# Patient Record
Sex: Male | Born: 1945
Health system: Southern US, Community
[De-identification: ages and names within clinical notes are randomized; demographics above are authoritative.]

## PROBLEM LIST (undated history)

## (undated) DIAGNOSIS — I471 Supraventricular tachycardia, unspecified: Secondary | ICD-10-CM

## (undated) DIAGNOSIS — H905 Unspecified sensorineural hearing loss: Secondary | ICD-10-CM

## (undated) DIAGNOSIS — H6690 Otitis media, unspecified, unspecified ear: Secondary | ICD-10-CM

## (undated) DIAGNOSIS — H902 Conductive hearing loss, unspecified: Secondary | ICD-10-CM

## (undated) DIAGNOSIS — G4730 Sleep apnea, unspecified: Secondary | ICD-10-CM

## (undated) DIAGNOSIS — F419 Anxiety disorder, unspecified: Secondary | ICD-10-CM

## (undated) DIAGNOSIS — K219 Gastro-esophageal reflux disease without esophagitis: Secondary | ICD-10-CM

## (undated) DIAGNOSIS — H9190 Unspecified hearing loss, unspecified ear: Secondary | ICD-10-CM

## (undated) DIAGNOSIS — Z720 Tobacco use: Secondary | ICD-10-CM

## (undated) DIAGNOSIS — C801 Malignant (primary) neoplasm, unspecified: Secondary | ICD-10-CM

## (undated) DIAGNOSIS — E119 Type 2 diabetes mellitus without complications: Secondary | ICD-10-CM

## (undated) DIAGNOSIS — D126 Benign neoplasm of colon, unspecified: Secondary | ICD-10-CM

## (undated) DIAGNOSIS — E7800 Pure hypercholesterolemia, unspecified: Secondary | ICD-10-CM

## (undated) DIAGNOSIS — I493 Ventricular premature depolarization: Secondary | ICD-10-CM

## (undated) DIAGNOSIS — J449 Chronic obstructive pulmonary disease, unspecified: Secondary | ICD-10-CM

## (undated) DIAGNOSIS — C4360 Malignant melanoma of unspecified upper limb, including shoulder: Secondary | ICD-10-CM

## (undated) HISTORY — PX: COLONOSCOPY: SHX174

## (undated) HISTORY — PX: SKIN BIOPSY: SHX1

## (undated) HISTORY — PX: LARYNGOSCOPY: SHX5203

## (undated) HISTORY — PX: OTHER SURGICAL HISTORY: SHX169

## (undated) HISTORY — PX: INNER EAR SURGERY: SHX679

## (undated) HISTORY — PX: SHOULDER ARTHROSCOPY WITH ROTATOR CUFF REPAIR: SHX5685

## (undated) HISTORY — PX: CARDIAC CATHETERIZATION: SHX172

---

## 2005-08-05 ENCOUNTER — Ambulatory Visit: Payer: Self-pay | Admitting: Cardiology

## 2005-08-12 ENCOUNTER — Ambulatory Visit: Payer: Self-pay

## 2006-01-14 ENCOUNTER — Ambulatory Visit: Payer: Self-pay | Admitting: Gastroenterology

## 2008-06-21 ENCOUNTER — Observation Stay: Payer: Self-pay | Admitting: Internal Medicine

## 2008-08-30 ENCOUNTER — Emergency Department: Payer: Self-pay | Admitting: Emergency Medicine

## 2009-08-18 ENCOUNTER — Ambulatory Visit: Payer: Self-pay | Admitting: Gastroenterology

## 2011-01-13 ENCOUNTER — Ambulatory Visit: Payer: Self-pay | Admitting: Otolaryngology

## 2011-02-03 ENCOUNTER — Ambulatory Visit: Payer: Self-pay | Admitting: Otolaryngology

## 2011-03-09 ENCOUNTER — Ambulatory Visit: Payer: Self-pay | Admitting: Otolaryngology

## 2011-03-09 LAB — BASIC METABOLIC PANEL
Anion Gap: 12 (ref 7–16)
BUN: 16 mg/dL (ref 7–18)
Chloride: 104 mmol/L (ref 98–107)
Co2: 24 mmol/L (ref 21–32)
Creatinine: 0.9 mg/dL (ref 0.60–1.30)
Potassium: 3.7 mmol/L (ref 3.5–5.1)
Sodium: 140 mmol/L (ref 136–145)

## 2011-03-09 LAB — CBC
HGB: 16 g/dL (ref 13.0–18.0)
MCH: 30.7 pg (ref 26.0–34.0)
MCHC: 33.4 g/dL (ref 32.0–36.0)
MCV: 92 fL (ref 80–100)
Platelet: 154 10*3/uL (ref 150–440)
RBC: 5.2 10*6/uL (ref 4.40–5.90)
RDW: 13 % (ref 11.5–14.5)

## 2011-03-29 ENCOUNTER — Ambulatory Visit: Payer: Self-pay | Admitting: Otolaryngology

## 2011-03-29 LAB — BASIC METABOLIC PANEL
Anion Gap: 12 (ref 7–16)
Calcium, Total: 8.2 mg/dL — ABNORMAL LOW (ref 8.5–10.1)
Chloride: 105 mmol/L (ref 98–107)
Co2: 25 mmol/L (ref 21–32)
Creatinine: 0.8 mg/dL (ref 0.60–1.30)
Potassium: 4.5 mmol/L (ref 3.5–5.1)
Sodium: 142 mmol/L (ref 136–145)

## 2011-03-29 LAB — CK TOTAL AND CKMB (NOT AT ARMC)
CK, Total: 85 U/L (ref 35–232)
CK-MB: 1.4 ng/mL (ref 0.5–3.6)

## 2011-03-29 LAB — T4, FREE: Free Thyroxine: 1.22 ng/dL (ref 0.76–1.46)

## 2011-03-29 LAB — MAGNESIUM: Magnesium: 1.6 mg/dL — ABNORMAL LOW

## 2011-03-29 LAB — TROPONIN I: Troponin-I: <0.02 ng/mL

## 2011-03-29 LAB — TSH: Thyroid Stimulating Horm: 3.47 u[IU]/mL

## 2011-04-26 ENCOUNTER — Inpatient Hospital Stay: Payer: Self-pay | Admitting: Otolaryngology

## 2011-04-26 LAB — BASIC METABOLIC PANEL
BUN: 15 mg/dL (ref 7–18)
BUN: 13 mg/dL (ref 7–18)
Calcium, Total: 8.1 mg/dL — ABNORMAL LOW (ref 8.5–10.1)
Chloride: 103 mmol/L (ref 98–107)
Co2: 26 mmol/L (ref 21–32)
Creatinine: 0.88 mg/dL (ref 0.60–1.30)
EGFR (African American): >60
EGFR (African American): >60
EGFR (Non-African Amer.): >60
EGFR (Non-African Amer.): >60
Glucose: 142 mg/dL — ABNORMAL HIGH (ref 65–99)
Glucose: 242 mg/dL — ABNORMAL HIGH (ref 65–99)
Osmolality: 279 (ref 275–301)
Potassium: 3.8 mmol/L (ref 3.5–5.1)
Potassium: 4 mmol/L (ref 3.5–5.1)
Sodium: 139 mmol/L (ref 136–145)

## 2011-04-26 LAB — CBC
HGB: 16.2 g/dL (ref 13.0–18.0)
MCH: 31.8 pg (ref 26.0–34.0)
MCHC: 33.8 g/dL (ref 32.0–36.0)
MCV: 93 fL (ref 80–100)
Platelet: 177 10*3/uL (ref 150–440)
Platelet: 177 10*3/uL (ref 150–440)
RBC: 5.19 10*6/uL (ref 4.40–5.90)
WBC: 18.5 10*3/uL — ABNORMAL HIGH (ref 3.8–10.6)

## 2011-04-28 LAB — PATHOLOGY REPORT

## 2012-12-27 ENCOUNTER — Ambulatory Visit: Payer: Self-pay | Admitting: Physician Assistant

## 2013-01-17 ENCOUNTER — Ambulatory Visit: Payer: Self-pay | Admitting: Orthopedic Surgery

## 2013-02-13 ENCOUNTER — Ambulatory Visit: Payer: Self-pay | Admitting: Orthopedic Surgery

## 2013-02-14 ENCOUNTER — Emergency Department: Payer: Self-pay | Admitting: Emergency Medicine

## 2013-09-18 ENCOUNTER — Ambulatory Visit: Payer: Self-pay | Admitting: Orthopedic Surgery

## 2014-05-18 NOTE — Op Note (Signed)
PATIENT NAME:  Stuart Ware, Stuart Ware MR#:  [id number] DATE OF BIRTH:  1945-08-16  DATE OF PROCEDURE:  02/13/2013  PREOPERATIVE DIAGNOSES: Right shoulder rotator cuff tear, acromioclavicular joint  arthrosis, subacromial impingement, partial tear of the biceps tendon, and fraying of the labrum.   POSTOPERATIVE DIAGNOSES: Right shoulder rotator cuff tear, acromioclavicular joint arthrosis, subacromial impingement, partial tear of the biceps tendon, and fraying of the labrum.   PROCEDURES: Right shoulder arthroscopic labral debridement, partial synovectomy, biceps tenotomy, subacromial decompression, distal clavicle excision, and mini open rotator cuff repair.   SURGEON: Thornton Park, MD   ANESTHESIA: General.  ESTIMATED BLOOD LOSS: Minimal.   COMPLICATIONS: None.   IMPLANTS: ArthroCare Opus Magnum II anchors x4 and Magnum M anchors x1.   REASON FOR SURGERY: The patient is a 69 year old male who injured his right shoulder while trying to lift a television at home. He felt a tear in his right shoulder and a MRI had confirmed a full thickness rotator cuff tear. Based on his pain and limitation of motion he opted to proceed with surgical repair of the rotator cuff. I had reviewed the risks and benefits of surgery with the patient prior to the date of surgery in my office. He understands the risks include infection, bleeding, nerve or blood vessel injury especially injury to the axillary nerve leading to permanent numbness or weakness, shoulder stiffness, persistent pain, failure of the rotator cuff repair or failure of the hardware and the need for further surgery. The patient's medical risks include but are not limited to DVT and pulmonary embolism, myocardial infarction, stroke, pneumonia, respiratory failure, and death. The patient understood these risks and wished to proceed.   DESCRIPTION OF PROCEDURE: The patient was met in the preoperative area. The right shoulder was signed with the word "yes"  according to the hospital's right site protocol. I answered all questions by the patient and his wife. He had an interscalene block placed by the anesthesiologist at the [facility].  The patient was brought to the operating room where he underwent general endotracheal intubation. He was placed in a beach chair position and a spider arm positioner was used for this case. Examination under anesthesia revealed no instability and full passive range of motion.   The patient was prepped and draped in sterile fashion. A timeout was performed to verify the patient's name, date of birth, medical record number, correct site of surgery, and correct procedure to be performed. It was also used to verify the patient had received antibiotics and that all appropriate instruments, implants, and radiographic studies were available in the room. Once all in attendance were in agreement, the case began.   The patient had bony landmarks drawn out with a surgical marker along with the proposed incisions. These were preinjected with 1% lidocaine plain. An 11 blade was used to establish a posterior portal and the arthroscope was placed into the glenohumeral joint from this portal. An anterior portal was then established under direct visualization using an 18-gauge spinal needle for localization. A 5.75 mm arthroscopic cannula was placed through the anterior portal. A full  diagnostic examination of the shoulder joint was performed.   Findings on arthroscopy included significant fraying of the posterior, superior, and anterior labrum, partial tear of the biceps tendon and significant glenohumeral synovitis. There was a full-thickness tear of the rotator cuff involving the supraspinatus and likely the anterior portion of the infraspinatus. The patient had subacromial spurs as well as AC joint arthrosis as well.  A 4.0 mm resector shaver blade was used through the anterior portal to perform arthroscopic debridement of the  labral tear. The patient had a biceps tenotomy performed using a 90 degree ArthroCare wand. The ArthroCare wand was also used to debride the synovitis in the glenohumeral joint. The subscapularis tendon had mild fraying of the superior fibers but no full-thickness tear. There were no cartilage fragments seen in the inferior recess.   The arthroscope was then placed into the subacromial space. An extensive debridement was then performed. To do this a lateral portal was established again using an 18-gauge spinal needle for localization. A 90 degree ArthroCare wand and a 4.0 resector shaver blade was used to perform an extensive bursectomy. Then a 5.5 mm resector shaver blade through the lateral portal was used to perform a subacromial decompression.   The 5.5 mm resector shaver blade was then placed through the anterior portal and a distal clavicle excision was performed. The subacromial space was then copiously irrigated. Two smart stitches were placed through the lateral portal into the lateral aspect of the rotator cuff tear arthroscopically. These were traction sutures which allowed me to assess the excursion of the rotator cuff. The rotator cuff could be easily brought back to its footprint on the greater tuberosity.   All arthroscopic instruments were then removed. A saber-type incision was made along the lateral acromion with a 15 blade. The fibers of the fascia of the deltoid were identified. This was then opened using a #15 blade in line with its fibers. The 2 Smart stitches which had been previously placed arthroscopically were brought out through the split in the deltoid. Two additional Smart stitches were placed along the lateral edge of the rotator cuff tear for a total of 4 sutures. Two Magnum M anchors were then placed at the articular margin with the humeral head at the greater tuberosity. A 5.5 mm resector blade was used to perform a debridement of the greater tuberosity to remove all remnants  of the torn rotator cuff until punctate bleeding was identified. Two suture limbs from the Magnum M anchors were then placed with a FirstPass suture passer for a clamp for later repair. These would be the lateral row repair sutures.   Four Magnum II anchors were then placed laterally in the greater tuberosity to allow for lateral row repair. These Magnum II anchors were tensioned to allow for the rotator cuff to be advanced onto its footprint on the greater tuberosity. Once all 4 anchors were placed, the Magnum M anchors were tied down medially for double row repair. Arthroscopic images both outside the shoulder and within the glenohumeral joint were taken of the repair. The repair had excellent approximation to the greater tuberosity. With shoulder rotation, the rotator cuff repair moved as a single unit and there was no gapping or "dog ears." The wound was then copiously irrigated. The self-retaining shoulder retractor was then removed. The deltoid fascia was closed with 0 Vicryl and the subacromial tissue closed with 2-0 Vicryl. The 3 arthroscopy incisions were closed with 4-0 nylon and the skin of the saber incision was closed with a running 4-0 Monocryl. Steri-Strips were applied along with a dry sterile dressing. The patient had a TENS unit, Polar Care sleeve, and an abduction sling applied to the right arm. He was brought to the PACU in stable condition. I was scrubbed and present for the entire case and all sharp and instrument counts were correct at the conclusion of the case.  I spoke with the patient's wife in the postop consultation room to let her know that the case had gone without complication and the patient was stable in the recovery room. I reviewed the discharge instructions with the patient prior to his discharge from the [facility] ambulatory surgery center.  ____________________________ Timoteo Gaul, MD [id number] D: 02/16/2013 13:49:03 ET T: 02/16/2013 14:27:31  ET JOB#: [id number]  cc: Timoteo Gaul, MD, <Dictator> Timoteo Gaul MD ELECTRONICALLY SIGNED 02/23/2013 15:27

## 2014-05-19 NOTE — Consult Note (Signed)
Present Illness 69 year old male with known diabetes mellitus, hypertension,  issues with severe sleep apnea and not amenable to CPAP use.  The patient cannot tolerate the CPAP machine.  Therefore, he was going to have surgery for uvuloplasty.  The patient was taken back to the surgery suite for which she had some mild amount of hypotension and some changes in his telemetry showing frequent preventricular contractions and ST depression.  The patient had no further ST depression upon discontinuation of induction.  The patient now has an EKG showing normal sinus rhythm, left axis deviation and nonspecific ST and T-wave changes with incomplete right bundle branch block.  This is significantly unchanged from his pre-operative assessment.  The telemetry strips with ST depression and a not significantly show true ischemia.  He does have some preventricular contraction.  There is no current clinical evidence of acute myocardia.  Troponin and CK-MB are within normal limits.  All other laboratory work appears to be relatively normal  Family history Multiple family members with early onset cardiovascular disease  Social history Patient denies alcohol use but smokes up pack per day   Physical Exam:   GEN WD    HEENT pink conjunctivae    NECK supple  No masses    RESP no use of accessory muscles    CARD Regular rate and rhythm    ABD denies tenderness  soft    LYMPH negative neck    EXTR negative cyanosis/clubbing    SKIN normal to palpation    NEURO cranial nerves intact    PSYCH alert   Review of Systems:   Subjective/Chief Complaint patient feels relatively well at this time, but short of breath    Respiratory: Short of breath    Review of Systems: All other systems were reviewed and found to be negative    Medications/Allergies Reviewed Medications/Allergies reviewed     Polyps:    Hypercholesterolemia:    GERD - Esophageal Reflux:    Arrythmias:    Lower Back Problems:     Diabetes:    Skin Cancer:    Sleep Apnea:    HOH:    Hyperlipidemia:    GERD - Esophageal Reflux:    COPD:    htn:    Colonoscopy: 2011   Ear Surgery: x 5  Home Medications: Medication Instructions Status  aspirin tablet 81 mg 1 tab(s) orally once a day  Active  metoprolol tablet 50 mg 1 tab(s) orally 2 times a day  Active  omeprazole delayed release capsule 20 mg 1 cap(s)  once a day (in the morning) Active  metformin 500 mg oral tablet 1 tab(s) orally once a day Active  enalapril 20 mg oral tablet 1 tab(s) orally once a day (in the morning) Active  simvastatin 80 mg oral tablet 0.5 tab(s) orally once a day (in the morning) Active  Spiriva 18 mcg inhalation capsule 1 each inhaled once a month Active  Symbicort 80 mcg-4.5 mcg/inh inhalation aerosol 2 puff(s) inhaled 2 times a day Active  cinnamon 500 mg oral capsule 2 cap(s) orally once a day Active  Aleve 220 mg oral tablet 1 tab(s) orally every 8 hours, As Needed Active  Tylenol Caplet Extra Strength 500 mg oral tablet 2 tab(s) orally every 6 hours, As Needed- for Pain  Active   Cardiac:  04-Mar-13 12:52    Troponin I < 0.02   CK, Total 85   CPK-MB, Serum 1.4  Routine Chem:  04-Mar-13 12:52  Glucose, Serum 139   BUN 14   Creatinine (comp) 0.80   Sodium, Serum 142   Potassium, Serum 4.5   Chloride, Serum 105   CO2, Serum 25   Calcium (Total), Serum 8.2   Anion Gap 12   Osmolality (calc) 286   eGFR (African American) >60   eGFR (Non-African American) >60   Magnesium, Serum 1.6  Thyroid:  04-Mar-13 12:52    Thyroid Stimulating Hormone 3.47   Thyroxine, Free 1.22   EKG:   EKG Interp. by me    Interpretation normal sinus rhythm with left axis deviation, nonspecific  ST changes with incomplete right bundle branch block    No Known Allergies:     Impression 69 year old male with diabetes mellitus, hypertension, hyperlipidemia, sleep apnea preventricular contractions, abnormal EKG with tobacco  abuse, having mild hypotension and ST changes with induction of anesthesia, now clinically stable without evidence of congestive heart failure or myocardial infarction    Plan 1.  Continue current medical regimen for hypertension, heart rate control including metoprolol. 2.  No change and lipid management.  Further risk reduction in myocardial infarction. 3.  Proceed to postoperative care without restriction.  Follow closely for any further significant symptoms requiring further intervention. 4.  Possible discharge to home with further cardiac .  Workup including possible stress test or echocardiogram. 5.  No additional medication management for preventricular contractions, which is likely secondary to sleep apnea. 6.  Diabetes control with goal hemoglobin A1c below 7. 7.  Patient was The Discontinuation of Tobacco Abuse. 8.  Followup 1 Week   Electronic Signatures: Corey Skains (MD)  (Signed 04-Mar-13 15:35)  Authored: General Aspect/Present Illness, History and Physical Exam, Review of System, Past Medical History, Home Medications, Labs, EKG , Allergies, Impression/Plan   Last Updated: 04-Mar-13 15:35 by Corey Skains (MD)

## 2014-05-19 NOTE — Op Note (Signed)
PATIENT NAME:  Stuart Ware, Stuart Ware MR#:  [id number] DATE OF BIRTH:  1945-07-22  DATE OF PROCEDURE:  04/26/2011  PREOPERATIVE DIAGNOSES:  1. Septal deviation.  2. Turbinate hypertrophy nasal obstruction.  3. Hypertrophied tonsils. 4. Elongated palate.  5. Enlarged tongue base.  6. Obstructive sleep apnea.   POSTOPERATIVE DIAGNOSES: 1. Septal deviation.  2. Turbinate hypertrophy nasal obstruction.  3. Hypertrophied tonsils. 4. Elongated palate.  5. Enlarged tongue base.  6. Obstructive sleep apnea.    OPERATIVE PROCEDURES:  1. Septoplasty.  2. Uvulopalatopharyngoplasty. 3. Tonsillectomy. 4. Genioglossal advancement.  5. Bilateral inferior turbinate reduction.   SURGEON: Huey Romans, MD   ANESTHESIA: General.   COMPLICATIONS: None.   TOTAL ESTIMATED BLOOD LOSS: 100 mL.   PROCEDURE: The patient was given general anesthesia by oral endotracheal intubation. His nose was addressed first. The septum was markedly deviated to his right side, large inferior spur. His nose was prepped using 9 mL of 1% Xylocaine with epi 1:100,000 for infiltration of the nasal septum and then cotton pledgets were placed using 4% Xylocaine and Afrin mixed on them. This was allowed to sit while he was prepped and draped in a sterile fashion.    A left Killian incision was created with elevation of mucoperichondrium on the left side of the quadrangular plate. Bony cartilaginous junction was split and the mucoperiosteum was elevated on both sides of the vomer and ethmoid. Some of the inferior posterior rim of cartilage was removed as it overhang on the maxillary crest on the right side. The maxillary crest buckled off to the right as well and a chisel was used to free some of this up and remove some of it from the airway. The very crooked vomer was fractured and removed and this opened up the airway on the right side significantly. Some of the crooked ethmoid plate was removed as well. The mucosal flaps were placed  back into its anatomic position. The left inferior turbinate was outfractured, cauterized along its inferior and posterior borders to help prevent swelling and blockage of the airway here. The right side also was outfractured and cauterized along its more posterior border where there were some polypoid changes bilaterally. A 4-0 plain gut suture was used in a through-and-through whipstitch fashion for closure of the nasal septum and the Killian incision as well. This left a good open airway on both sides. Xomed 0.5 mm regular size splints were trimmed, placed on both sides of the septum to hold it in position. No further packing was used.   The patient was placed in Trendelenburg position. A Davis mouth gag was used to visualize the oropharynx and his tongue was retracted. The tonsils were enlarged and cryptic. They had debris hiding in the cleft in his midline. The tonsils were grasped and pulled medially, the anterior pillar incised. They were dissected from their fossa using sharp and blunt dissection. Bleeding was controlled with electrocautery. The uvulopalatoplasty was done with incising the palate vertically on both sides at the level of the posterior tonsillar pillar. This allowed for a longer posterior flap. This was then cut more horizontally and the entire posterior tonsillar pillar was rotated laterally and sutured to the anterior tonsillar pillar on both sides. This was done with interrupted mattress sutures of 4-0 Vicryl. This lateralized the soft tissue of the posterior pharynx to remove the redundancy of the tissue here. A cm of the midline palate was then removed leaving a stub of uvula muscle to create a new uvula. The  nasopharyngeal surface mucosa was then sutured to the oropharyngeal surface to close the inferior border of the trimmed palate. This left good opening through the nasopharynx at rest to help tether the palate forward some with the sutures.   The genioglossal advancement was then  done by placing the mouth gag on the right side. A silk stitch was placed in the tongue to use it for retraction. An incision was made low anterior floor of mouth just behind the mandible. Blunt dissection was done to carry it down to the mandible. The 180 degree screwdriver was then placed into this area and a small 3 mm size self-tapping screw was placed into the inside border of the anterior mandible. Two permanent sutures that were attached to it were brought out through the incision in the mouth. One was then passed through the tongue from the anterior midline to the right posterior tongue. This has been thread on a large curved needle, crossed to the opposite side, and then it was attached to a passer stitch that brought it forward back to the anterior midline again. This created a triangular type of suture in the tongue wider at the base and in the midline anteriorly. This was then snugged up to pull the tongue base forward and support the genioglossal muscle. A suture was tied and left buried in the anterior floor of mouth. Stitch of 4-0 Vicryl was used to close the incision here. The suture was taken out of the tongue and bite block was removed.   The patient tolerated the procedure well. He was awakened and taken to the recovery room in satisfactory condition. There were no operative complications. Total estimated blood loss was 100 mL.    ____________________________ Huey Romans, MD [id number] D: 04/26/2011 22:52:32 ET T: 04/27/2011 10:51:01 ET JOB#: [id number]  cc: Huey Romans, MD, <Dictator> Huey Romans MD ELECTRONICALLY SIGNED 04/28/2011 12:41

## 2014-05-19 NOTE — Consult Note (Signed)
PATIENT NAME:  Stuart Ware, Stuart Ware MR#:  [id number] DATE OF BIRTH:  10-27-1945  DATE OF CONSULTATION:  04/26/2011  REFERRING PHYSICIAN:   Margaretha Sheffield, MD  CONSULTING PHYSICIAN:  Vivien Presto, MD  REASON FOR CONSULT:  Hypoxia, urinary retention, high blood pressure.   PRIMARY CARE PHYSICIAN: Dr. Ilene Qua  HISTORY OF PRESENT ILLNESS: The patient is a 69 year old obese Caucasian male with history of untreated long time obstructive sleep apnea, who is not tolerating CPAP, hypertension, PVC, chronic obstructive pulmonary disease, diabetes, who is status post septoplasty, UPPP,  turbinate reduction, genioglossal advancement, and tonsillectomy today. Postoperatively the patient had progressive hypoxia and currently is on 100% nonrebreather. The patient also had some urinary retention and blood pressure was 010 systolic. Medicine was consulted for further evaluation. Per my conversation with the patient and wife, he has no history of congestive heart failure. He has chronic dyspnea on exertion and tolerates about a block without being short of breath. He has had no fevers or chills. He was pretty much at about his baseline prior to surgery. Postoperative x-ray of the chest was done as well as an ABG. ABG shows a pH of 7.36, pCO2 44, and pO2 of 78 on nonrebreather. X-ray of the chest shows borderline cardiomegaly.  However, there is no infiltrate and pulmonary vascularity is normal. He has had Decadron already. He denies having any chest pains or palpitations. However, he says that he has difficulty breathing.   PAST MEDICAL HISTORY:  1. History of impaired hearing status post surgeries.  2. Hypertension.  3. Irregular heartbeat/ premature ventricular contractions.  4. Chronic obstructive pulmonary disease.  5. Possible asthma.  6. Diabetes.  7. Gastroesophageal reflux disease. 8. History of skin cancer.  9. Obstructive sleep apnea.   MEDICATIONS: 1. Aleve 1 tab every eight hours as needed, 220 mg.   2. Aspirin 81 mg daily.  3. Cinnamon 500 mg, two caps once a day.  4. Enalapril 20 mg daily.  5. Metformin 500 mg daily.  6. Metoprolol 50 mg 2 times a day.  7. Omeprazole 20 mg in the morning.  8. Simvastatin 80 mg, half tab a day. 9. Spiriva 18 mcg inhaled daily.  10. Symbicort 80/4.5 mcg, 2 puffs twice a day. 11. Tylenol as needed.   FAMILY HISTORY: Father died from an enlarged heart. Mother with chronic obstructive pulmonary disease.   SOCIAL HISTORY: Quit smoking 15 years ago. Occasional alcohol. No drugs. Lives with his wife.   ALLERGIES: No known drug allergies.   REVIEW OF SYSTEMS:  CONSTITUTIONAL: Denies having any fever, feels weak overall, has had some weight loss recently but in the long term has had weight gain. EYES: No blurry vision or double vision. ENT: No tinnitus.  The patient has a history of obstructive sleep apnea. RESPIRATORY: No cough. Chronic shortness of breath and dyspnea on exertion. No painful respirations. He has a history of chronic obstructive pulmonary disease and obstructive sleep apnea. CARDIOVASCULAR: Denies having any chest pains. Has one-pillow orthopnea. No swelling in the legs. Has history of PVC, chronic dyspnea on exertion. GASTROINTESTINAL: No nausea, vomiting, diarrhea, or abdominal pain. No constipation or rectal bleeding. GENITOURINARY: Denies dysuria or incontinence. Has some retention currently. ENDOCRINE: Denies polyuria or nocturia. HEME/LYMPH: No anemia or easy bruising. SKIN: Has a history of skin cancer. Denies any rashes. NEUROLOGICAL: Overall weak without any focal weakness. PSYCHIATRIC: Denies anxiety or insomnia.   PHYSICAL EXAMINATION:  VITAL SIGNS: Afebrile, heart rate 85, blood pressure 152/72, oxygen saturation 92% on nonrebreather,  respiratory rate 15.  GENERAL:  The patient is an obese Caucasian male sitting in bed in mild distress, on nonrebreather.   HEENT: Normocephalic, atraumatic. Pupils are equal and reactive. Anicteric  sclerae. Extensive swelling in the mouth area, soft palate, and some dry blood in bilateral nostrils. Some tenderness on palpation of the throat and some edema of the tongue. However, exam is limited secondary to pain and being postoperative.   CARDIOVASCULAR: S1, S2. Regular rate and rhythm. No murmurs, rubs, or gallops.   LUNGS: Transmitted upper airway sounds.   ABDOMEN: Soft, nontender, nondistended. Positive bowel sounds.  GU: The patient has a Foley.   EXTREMITIES: No significant edema. Strength five out of five in all extremities.   NEURO: Unable to do full neuro exam but the patient moves all extremities. No facial asymmetry. Pupils are equal and reactive.   LABS/STUDIES: Glucose 156, creatinine 0.88, sodium 138, potassium 3.8. WBC 10.8, hemoglobin 16.2, hematocrit 48.1, platelets are 177. pH 7.36 on the ABG, pCO2 44, pO2 78. X-ray of the chest: No acute abnormalities.   ASSESSMENT AND PLAN: We have a 69 year old Caucasian male with a long-term history of obstructive sleep apnea and chronic obstructive pulmonary disease, not on oxygen as an outpatient, status post septoplasty, UPPP, turbinate reduction, tonsillectomy, and genioglossal advancement with postoperative hypoxia, elevated blood pressure, and urinary retention. The patient does not have a history of congestive heart failure, is not on oxygen as an outpatient. Here he has been requiring increasing levels of oxygen and currently is on 100% nonrebreather. Oxygen sats have been in the high 90s on this. X-ray of the chest is negative for pneumonia or significant pleural effusions. He has no fever, although he has some mild leukocytosis. This could be in the setting of swelling in the upper or lower airway in the setting of surgical procedure. Doubt this is acute congestive heart failure. I will check a BNP and check an echocardiogram. The patient does not appear to be volume overloaded. The patient will also be checked for a pulmonary  embolism.  We will get a CT of the chest with contrast. This would  also give [place] a better evaluation of his lungs and  emphysema.  He has already received some Decadron. I discussed the case with Dr. Kathyrn Sheriff from ENT who will re-evaluate the patient later this afternoon to see if there is any progression of swelling in the upper airways. I will transfer the patient to Critical Care Unit for observation overnight and resume his Symbicort and start him on his nebs.   For his high blood pressure I will start labetalol p.r.n. and resume his beta blocker. He does have urinary retention. Initially, he had about 800 mL and the second time had 500 mL. Currently he has a Foley which I would continue for now. This possibly is secondary to anesthesia and postop. I will start the patient on sliding scale insulin for now. I will check a hemoglobin A1c.  I discussed the case with the patient's family and Dr. Kathyrn Sheriff.  CRITICAL CARE TIME SPENT: 60 minutes.   ____________________________ Vivien Presto, MD sa:bjt D: 04/26/2011 16:46:18 ET T: 04/27/2011 10:38:29 ET JOB#: [id number]  cc: Vivien Presto, MD, <Dictator> Fonnie Jarvis. Ilene Qua, MD Vivien Presto MD ELECTRONICALLY SIGNED 05/02/2011 15:07

## 2015-06-18 DIAGNOSIS — I6789 Other cerebrovascular disease: Secondary | ICD-10-CM | POA: Diagnosis not present

## 2015-06-20 IMAGING — CR DG SHOULDER 3+V*R*
1 series · 3 of 3 positions shown · non-contrast
Comparison: None.

CLINICAL DATA: Right shoulder pain post injury

EXAM:
DG SHOULDER 3+ VIEWS RIGHT

[Series 1: grashey · 0.17mm/px · 3 of 3 slices shown]
[im 1/3]
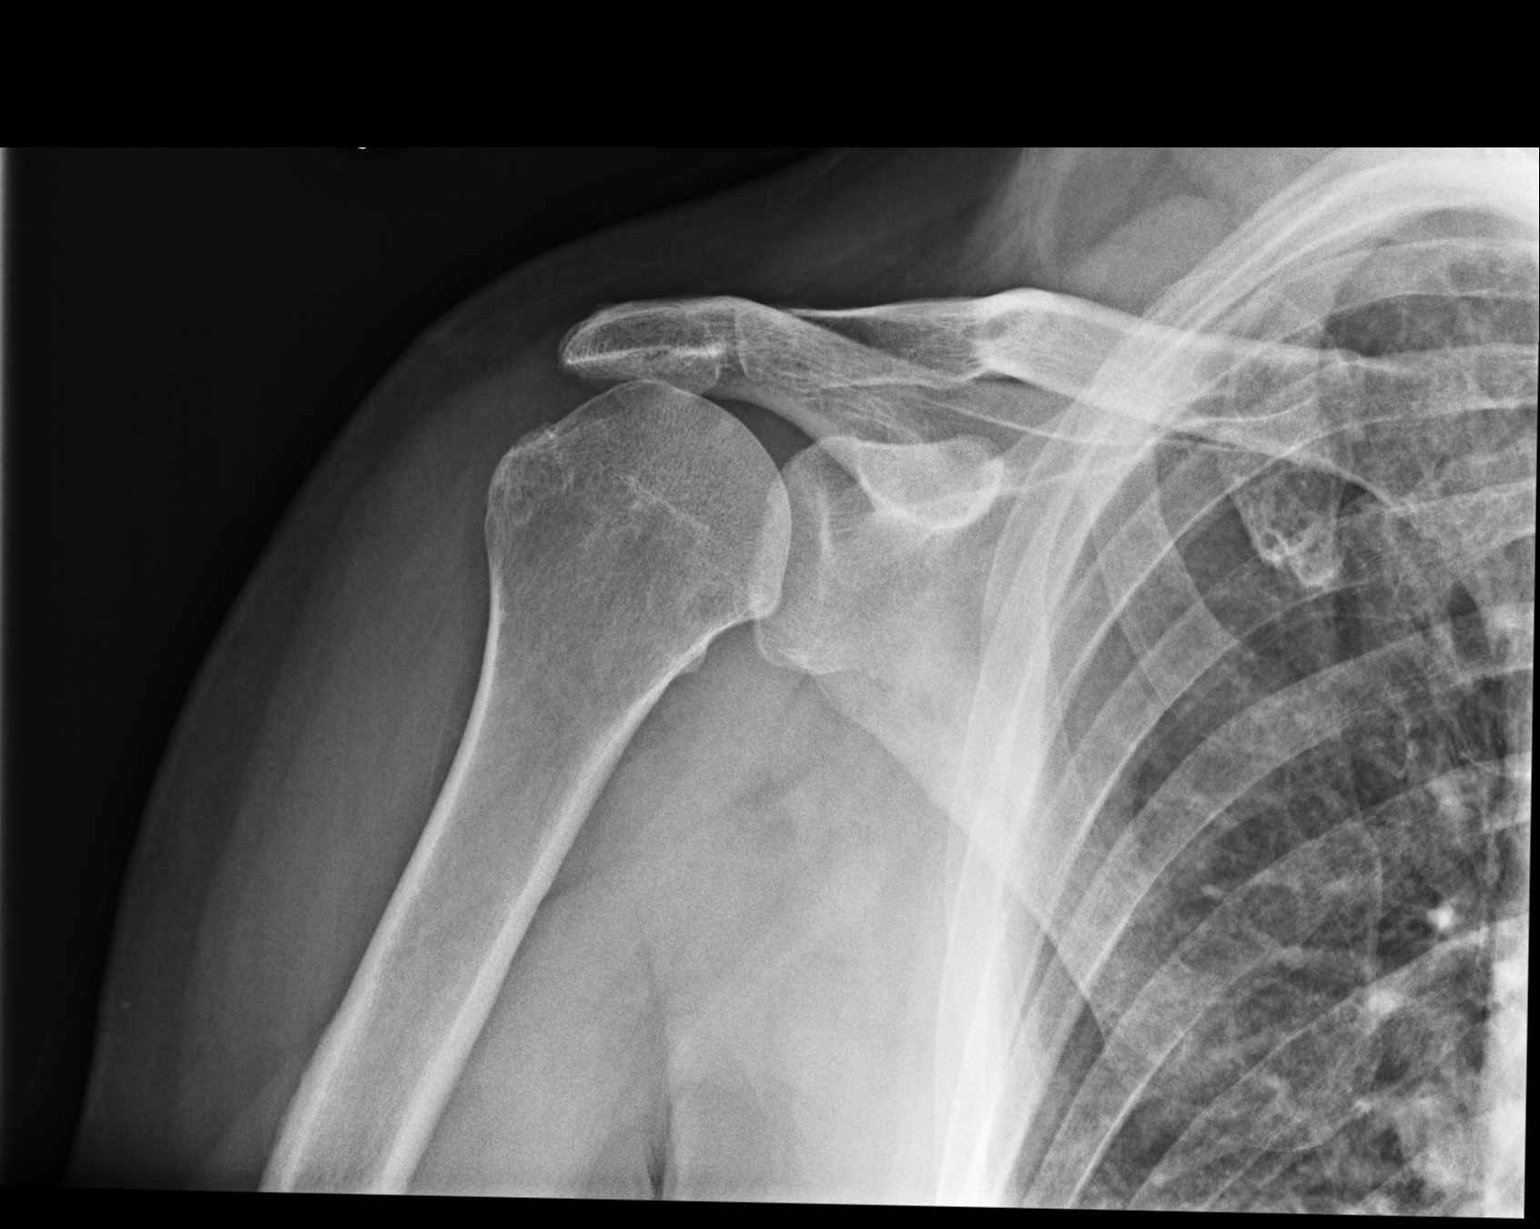
[im 2/3]
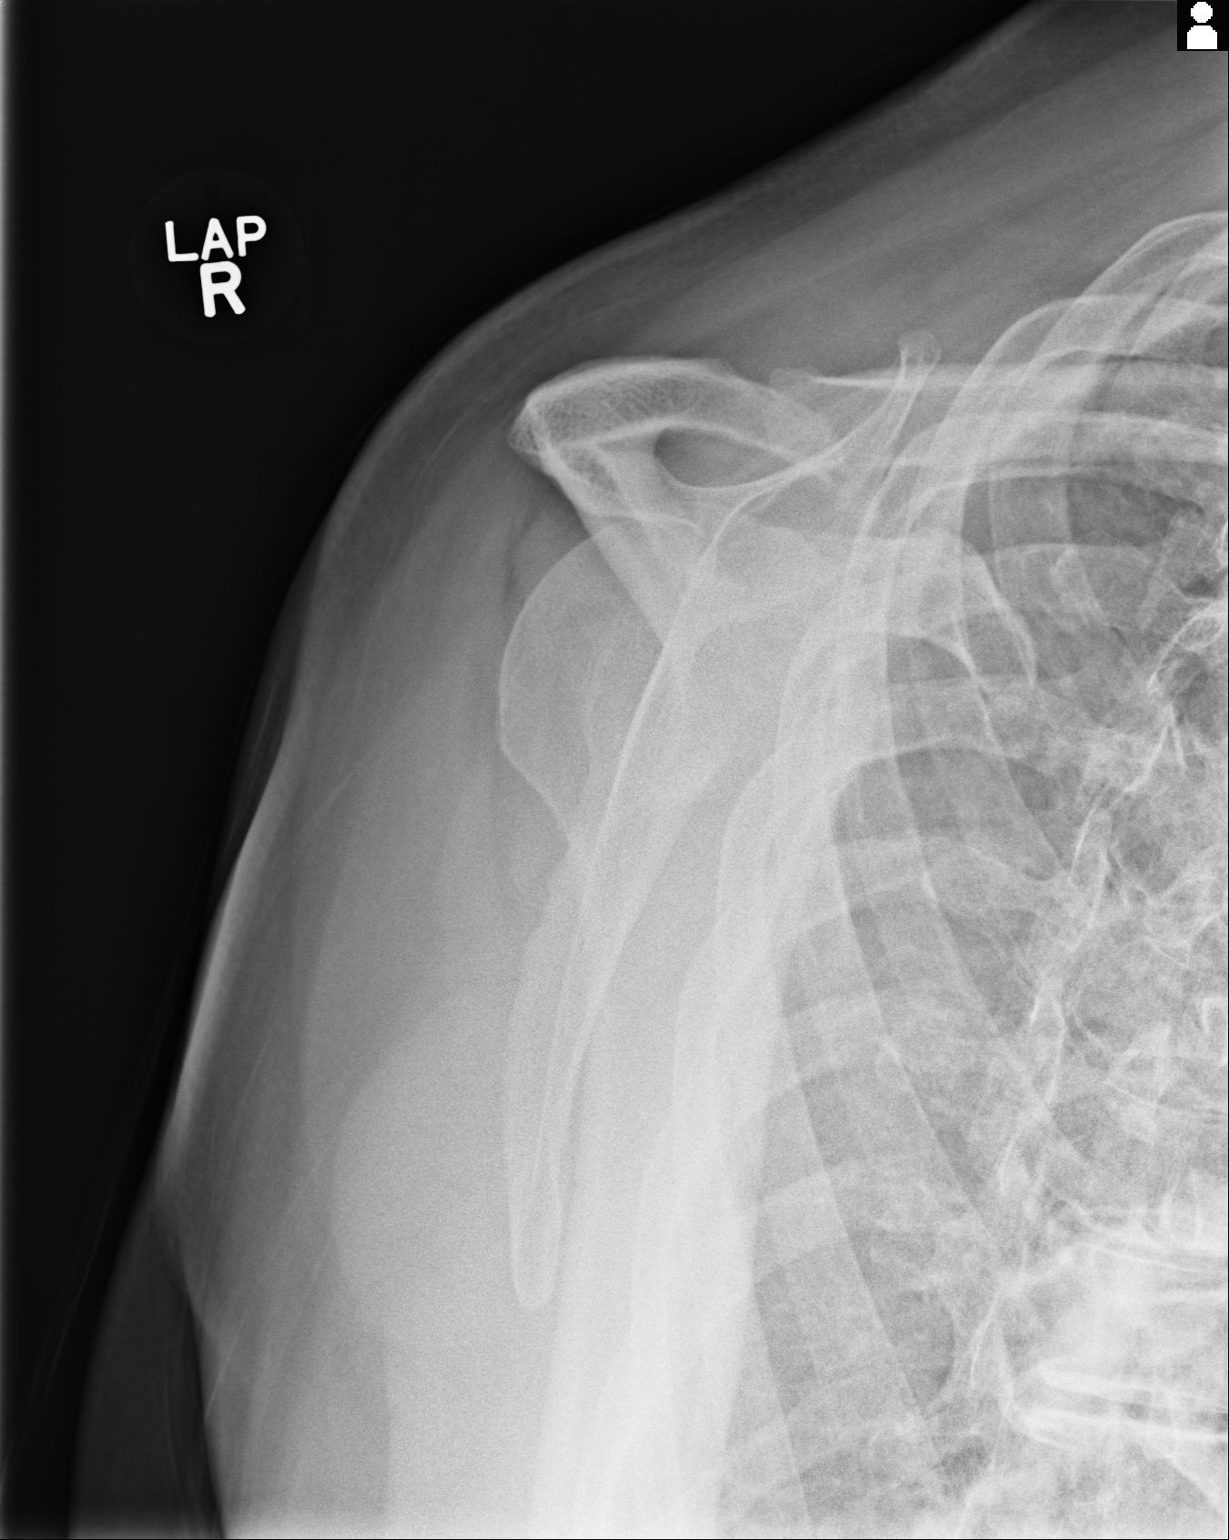
[im 3/3]
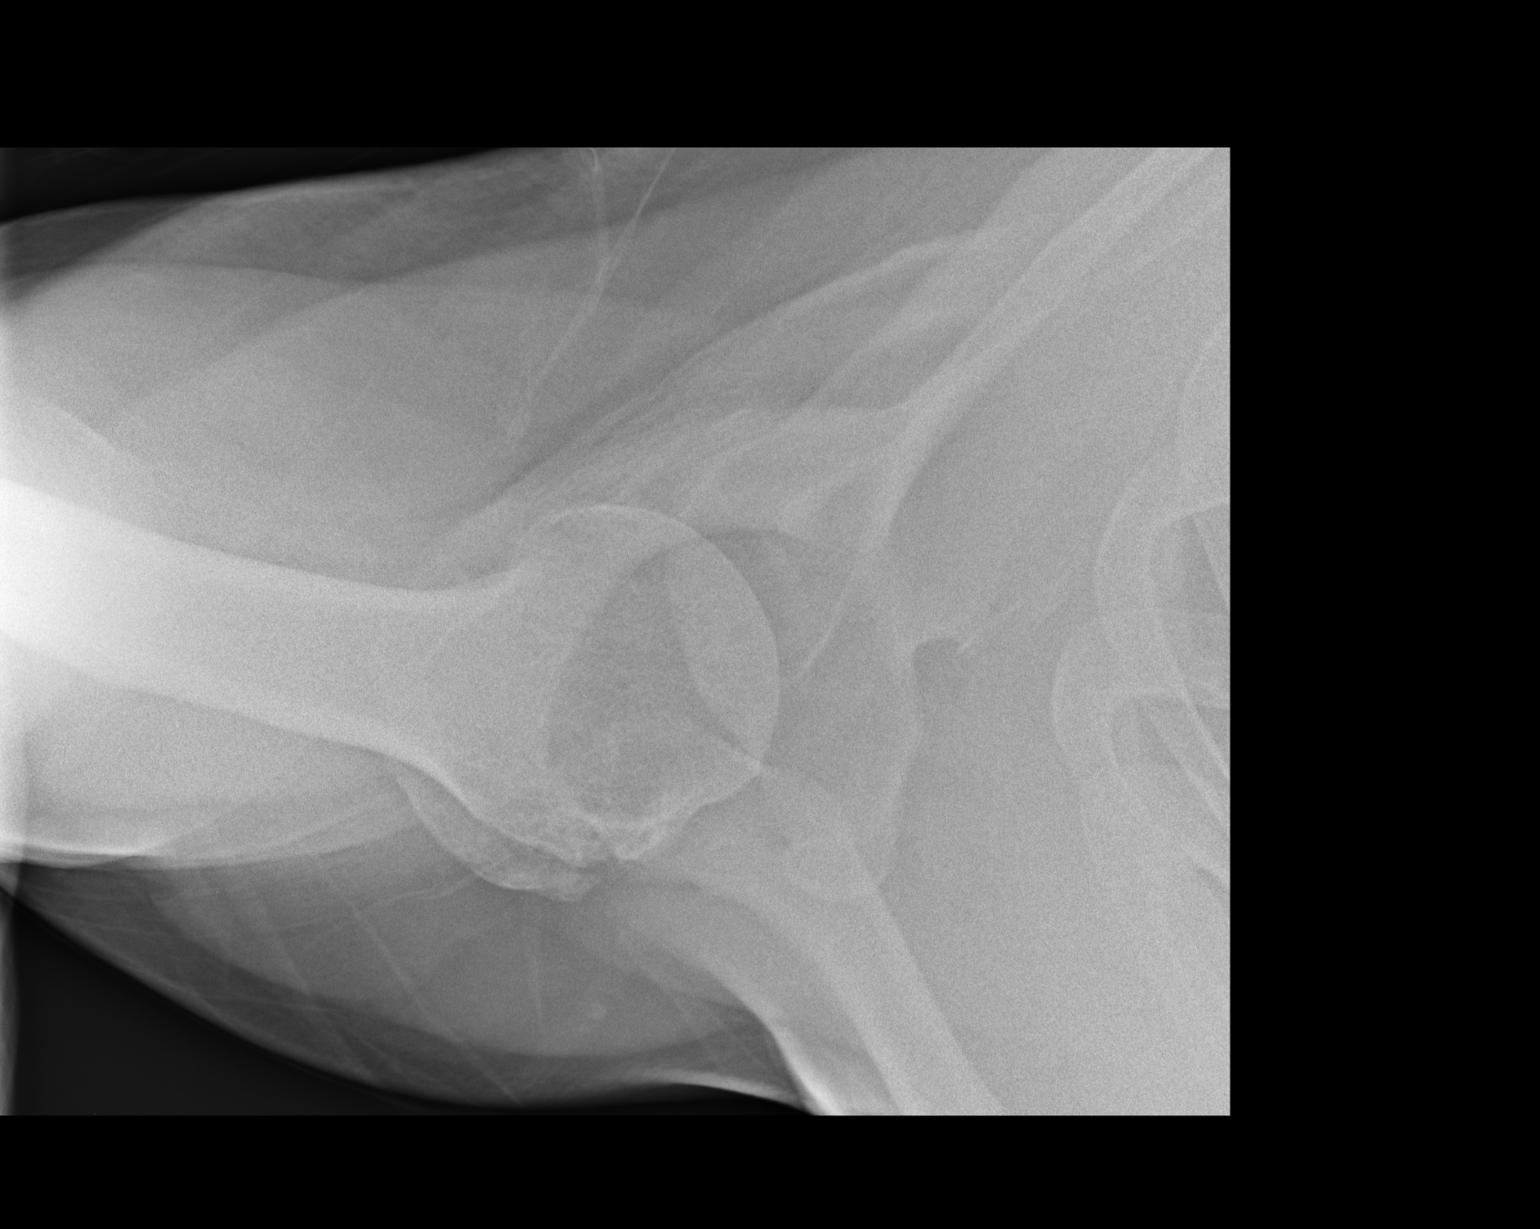

[3 of 3 positions shown; findings below may reference images not displayed]

FINDINGS: Three views of right shoulder submitted. No acute fracture or
subluxation minimal spurring of distal clavicle.
IMPRESSION: No acute fracture or subluxation. Minimal spurring of distal
clavicle.

## 2015-09-03 DIAGNOSIS — Z7984 Long term (current) use of oral hypoglycemic drugs: Secondary | ICD-10-CM | POA: Diagnosis not present

## 2015-09-03 DIAGNOSIS — I5040 Unspecified combined systolic (congestive) and diastolic (congestive) heart failure: Secondary | ICD-10-CM | POA: Diagnosis not present

## 2015-09-03 DIAGNOSIS — B9620 Unspecified Escherichia coli [E. coli] as the cause of diseases classified elsewhere: Secondary | ICD-10-CM | POA: Diagnosis not present

## 2015-09-03 DIAGNOSIS — J449 Chronic obstructive pulmonary disease, unspecified: Secondary | ICD-10-CM | POA: Diagnosis not present

## 2015-09-03 DIAGNOSIS — Z87891 Personal history of nicotine dependence: Secondary | ICD-10-CM | POA: Diagnosis not present

## 2015-09-03 DIAGNOSIS — Z452 Encounter for adjustment and management of vascular access device: Secondary | ICD-10-CM | POA: Diagnosis not present

## 2015-09-03 DIAGNOSIS — G4733 Obstructive sleep apnea (adult) (pediatric): Secondary | ICD-10-CM | POA: Diagnosis not present

## 2015-09-03 DIAGNOSIS — E785 Hyperlipidemia, unspecified: Secondary | ICD-10-CM | POA: Diagnosis not present

## 2015-09-03 DIAGNOSIS — I110 Hypertensive heart disease with heart failure: Secondary | ICD-10-CM | POA: Diagnosis not present

## 2015-09-03 DIAGNOSIS — H7091 Unspecified mastoiditis, right ear: Secondary | ICD-10-CM | POA: Diagnosis not present

## 2015-09-03 DIAGNOSIS — E119 Type 2 diabetes mellitus without complications: Secondary | ICD-10-CM | POA: Diagnosis not present

## 2015-09-05 DIAGNOSIS — I110 Hypertensive heart disease with heart failure: Secondary | ICD-10-CM | POA: Diagnosis not present

## 2015-09-05 DIAGNOSIS — I5040 Unspecified combined systolic (congestive) and diastolic (congestive) heart failure: Secondary | ICD-10-CM | POA: Diagnosis not present

## 2015-09-05 DIAGNOSIS — H7091 Unspecified mastoiditis, right ear: Secondary | ICD-10-CM | POA: Diagnosis not present

## 2015-09-05 DIAGNOSIS — Z452 Encounter for adjustment and management of vascular access device: Secondary | ICD-10-CM | POA: Diagnosis not present

## 2015-09-05 DIAGNOSIS — E119 Type 2 diabetes mellitus without complications: Secondary | ICD-10-CM | POA: Diagnosis not present

## 2015-09-05 DIAGNOSIS — B9620 Unspecified Escherichia coli [E. coli] as the cause of diseases classified elsewhere: Secondary | ICD-10-CM | POA: Diagnosis not present

## 2015-09-08 DIAGNOSIS — B9620 Unspecified Escherichia coli [E. coli] as the cause of diseases classified elsewhere: Secondary | ICD-10-CM | POA: Diagnosis not present

## 2015-09-08 DIAGNOSIS — Z452 Encounter for adjustment and management of vascular access device: Secondary | ICD-10-CM | POA: Diagnosis not present

## 2015-09-08 DIAGNOSIS — E119 Type 2 diabetes mellitus without complications: Secondary | ICD-10-CM | POA: Diagnosis not present

## 2015-09-08 DIAGNOSIS — I5040 Unspecified combined systolic (congestive) and diastolic (congestive) heart failure: Secondary | ICD-10-CM | POA: Diagnosis not present

## 2015-09-08 DIAGNOSIS — H7091 Unspecified mastoiditis, right ear: Secondary | ICD-10-CM | POA: Diagnosis not present

## 2015-09-08 DIAGNOSIS — I110 Hypertensive heart disease with heart failure: Secondary | ICD-10-CM | POA: Diagnosis not present

## 2015-09-13 DIAGNOSIS — B9620 Unspecified Escherichia coli [E. coli] as the cause of diseases classified elsewhere: Secondary | ICD-10-CM | POA: Diagnosis not present

## 2015-09-13 DIAGNOSIS — H7091 Unspecified mastoiditis, right ear: Secondary | ICD-10-CM | POA: Diagnosis not present

## 2015-09-13 DIAGNOSIS — I5040 Unspecified combined systolic (congestive) and diastolic (congestive) heart failure: Secondary | ICD-10-CM | POA: Diagnosis not present

## 2015-09-13 DIAGNOSIS — I110 Hypertensive heart disease with heart failure: Secondary | ICD-10-CM | POA: Diagnosis not present

## 2015-09-13 DIAGNOSIS — Z452 Encounter for adjustment and management of vascular access device: Secondary | ICD-10-CM | POA: Diagnosis not present

## 2015-09-13 DIAGNOSIS — E119 Type 2 diabetes mellitus without complications: Secondary | ICD-10-CM | POA: Diagnosis not present

## 2015-09-14 ENCOUNTER — Encounter: Payer: Self-pay | Admitting: Gynecology

## 2015-09-14 ENCOUNTER — Ambulatory Visit
Admission: EM | Admit: 2015-09-14 | Discharge: 2015-09-14 | Disposition: A | Discharge status: Home / Self Care | Payer: Medicare Other | Attending: Family Medicine | Admitting: Family Medicine

## 2015-09-14 DIAGNOSIS — N390 Urinary tract infection, site not specified: Secondary | ICD-10-CM

## 2015-09-14 DIAGNOSIS — R300 Dysuria: Secondary | ICD-10-CM | POA: Diagnosis not present

## 2015-09-14 HISTORY — DX: Otitis media, unspecified, unspecified ear: H66.90

## 2015-09-14 HISTORY — DX: Tobacco use: Z72.0

## 2015-09-14 HISTORY — DX: Type 2 diabetes mellitus without complications: E11.9

## 2015-09-14 HISTORY — DX: Unspecified sensorineural hearing loss: H90.5

## 2015-09-14 HISTORY — DX: Unspecified hearing loss, unspecified ear: H91.90

## 2015-09-14 HISTORY — DX: Conductive hearing loss, unspecified: H90.2

## 2015-09-14 HISTORY — DX: Benign neoplasm of colon, unspecified: D12.6

## 2015-09-14 HISTORY — DX: Chronic obstructive pulmonary disease, unspecified: J44.9

## 2015-09-14 HISTORY — DX: Anxiety disorder, unspecified: F41.9

## 2015-09-14 HISTORY — DX: Ventricular premature depolarization: I49.3

## 2015-09-14 HISTORY — DX: Supraventricular tachycardia: I47.1

## 2015-09-14 HISTORY — DX: Supraventricular tachycardia, unspecified: I47.10

## 2015-09-14 HISTORY — DX: Malignant (primary) neoplasm, unspecified: C80.1

## 2015-09-14 HISTORY — DX: Pure hypercholesterolemia, unspecified: E78.00

## 2015-09-14 HISTORY — DX: Sleep apnea, unspecified: G47.30

## 2015-09-14 HISTORY — DX: Gastro-esophageal reflux disease without esophagitis: K21.9

## 2015-09-14 HISTORY — DX: Malignant melanoma of unspecified upper limb, including shoulder: C43.60

## 2015-09-14 LAB — URINALYSIS COMPLETE WITH MICROSCOPIC (ARMC ONLY)
Bilirubin Urine: NEGATIVE
Glucose, UA: NEGATIVE mg/dL
Hgb urine dipstick: NEGATIVE
Ketones, ur: NEGATIVE mg/dL
LEUKOCYTES UA: NEGATIVE
Nitrite: NEGATIVE
PH: 5 (ref 5.0–8.0)
PROTEIN: NEGATIVE mg/dL
Specific Gravity, Urine: 1.025 (ref 1.005–1.030)

## 2015-09-14 MED ORDER — CIPROFLOXACIN HCL 500 MG PO TABS: 500.0000 mg | ORAL_TABLET | Freq: Two times a day (BID) | ORAL | 0 refills | Status: DC

## 2015-09-14 NOTE — ED Provider Notes (Signed)
[facility]    CSN: [id number] Arrival date & time: 09/14/15  0809  First Provider Contact:  None       History   Chief Complaint Chief Complaint  Patient presents with  . Urinary Tract Infection    HPI Stuart Ware is a 70 y.o. male.   HPI: Patient presents today with history of having ear surgery last week and having a catheter placed due to inability to urinate. Patient states that the catheter was taken out 2 days ago. He states that yesterday it was difficult for him to urinate and he did feel some suprapubic pressure. He states that today the flow is much better and he has less discomfort. He wanted to come in today to make sure that he does not have an infection. He denies any nausea, vomiting, fever, constipation, diarrhea, hematuria, flank pain.  No past medical history on file.  There are no active problems to display for this patient.   No past surgical history on file.     Home Medications    Prior to Admission medications   Not on File    Family History No family history on file.  Social History Social History  Substance Use Topics  . Smoking status: Former Research scientist (life sciences)  . Smokeless tobacco: Never Used  . Alcohol use No     Allergies   Review of patient's allergies indicates no known allergies.   Review of Systems Review of Systems: Negative except mentioned above.   Physical Exam Triage Vital Signs ED Triage Vitals  Enc Vitals Group     BP 09/14/15 0830 123/75     Pulse Rate 09/14/15 0830 67     Resp 09/14/15 0830 16     Temp 09/14/15 0830 97.7 F (36.5 C)     Temp Source 09/14/15 0830 Oral     SpO2 09/14/15 0830 97 %     Weight 09/14/15 0832 203 lb (92.1 kg)     Height 09/14/15 0832 6\' 2"  (1.88 m)     Head Circumference --      Peak Flow --      Pain Score 09/14/15 0837 3     Pain Loc --      Pain Edu? --      Excl. in [place]? --    No data found.   Updated Vital Signs BP 123/75 (BP Location: Left Arm)   Pulse 67    Temp 97.7 F (36.5 C) (Oral)   Resp 16   Ht 6\' 2"  (1.88 m)   Wt 203 lb (92.1 kg)   SpO2 97%   BMI 26.06 kg/m      Physical Exam:  GENERAL: NAD RESP: CTA B CARD: RRR ABD: +BS, soft, minimal suprapubic tenderness, no flank tenderness, no significant tenderness with prostate exam   UC Treatments / Results  Labs (all labs ordered are listed, but only abnormal results are displayed) Labs Reviewed  URINALYSIS COMPLETEWITH MICROSCOPIC ([place])    EKG  EKG Interpretation None       Radiology No results found.  Procedures Procedures (including critical care time)  Medications Ordered in UC Medications - No data to display   Initial Impression / Assessment and Plan / UC Course  I have reviewed the triage vital signs and the nursing notes.  Pertinent labs & imaging results that were available during my care of the patient were reviewed by me and considered in my medical decision making (see chart for details).  Clinical  Course  A/P: Dysuria, suprapubic pressure - UA reviewed and sent for culture, will treat patient with Cipro, could have some prostatitis issues going on, I have advised the patient that if he has any worsening of his symptoms to go to the ER. He will be able to get a bladder scan and also have a catheter reinserted if needed. Follow up with primary care physician this week.  Final Clinical Impressions(s) / UC Diagnoses   Final diagnoses:  None    New Prescriptions New Prescriptions   No medications on file     Paulina Fusi, MD 09/14/15 [phone]

## 2015-09-14 NOTE — ED Triage Notes (Signed)
Per patient had right ear surgery on 09/04/15. Patient stated had catheter insert after surgery. Pt. Stated  Had catheter remove on 09/12/15. Per pt. Now with pain when urinating.

## 2015-09-14 NOTE — Discharge Instructions (Signed)
If any further problems as discussed go to the ER.

## 2015-09-16 LAB — URINE CULTURE

## 2015-09-20 DIAGNOSIS — H7091 Unspecified mastoiditis, right ear: Secondary | ICD-10-CM | POA: Diagnosis not present

## 2015-09-20 DIAGNOSIS — I110 Hypertensive heart disease with heart failure: Secondary | ICD-10-CM | POA: Diagnosis not present

## 2015-09-20 DIAGNOSIS — E119 Type 2 diabetes mellitus without complications: Secondary | ICD-10-CM | POA: Diagnosis not present

## 2015-09-20 DIAGNOSIS — Z452 Encounter for adjustment and management of vascular access device: Secondary | ICD-10-CM | POA: Diagnosis not present

## 2015-09-20 DIAGNOSIS — B9620 Unspecified Escherichia coli [E. coli] as the cause of diseases classified elsewhere: Secondary | ICD-10-CM | POA: Diagnosis not present

## 2015-09-20 DIAGNOSIS — I5040 Unspecified combined systolic (congestive) and diastolic (congestive) heart failure: Secondary | ICD-10-CM | POA: Diagnosis not present

## 2016-04-09 DIAGNOSIS — R079 Chest pain, unspecified: Secondary | ICD-10-CM | POA: Diagnosis not present

## 2016-06-04 DIAGNOSIS — M25511 Pain in right shoulder: Secondary | ICD-10-CM | POA: Diagnosis not present

## 2016-06-04 DIAGNOSIS — W1830XA Fall on same level, unspecified, initial encounter: Secondary | ICD-10-CM | POA: Diagnosis not present

## 2016-11-13 ENCOUNTER — Ambulatory Visit: Payer: Medicare Other

## 2016-11-13 ENCOUNTER — Ambulatory Visit
Admission: EM | Admit: 2016-11-13 | Discharge: 2016-11-13 | Disposition: A | Discharge status: Nursing Home (Includes ICF) | Payer: Medicare Other | Attending: Family | Admitting: Family

## 2016-11-13 DIAGNOSIS — R519 Headache, unspecified: Secondary | ICD-10-CM

## 2016-11-13 DIAGNOSIS — M6281 Muscle weakness (generalized): Secondary | ICD-10-CM | POA: Diagnosis not present

## 2016-11-13 DIAGNOSIS — J449 Chronic obstructive pulmonary disease, unspecified: Secondary | ICD-10-CM | POA: Diagnosis not present

## 2016-11-13 DIAGNOSIS — R0602 Shortness of breath: Secondary | ICD-10-CM

## 2016-11-13 DIAGNOSIS — H7090 Unspecified mastoiditis, unspecified ear: Secondary | ICD-10-CM | POA: Diagnosis not present

## 2016-11-13 DIAGNOSIS — R059 Cough, unspecified: Secondary | ICD-10-CM

## 2016-11-13 DIAGNOSIS — R05 Cough: Secondary | ICD-10-CM

## 2016-11-13 DIAGNOSIS — R51 Headache: Secondary | ICD-10-CM

## 2016-11-13 DIAGNOSIS — R918 Other nonspecific abnormal finding of lung field: Secondary | ICD-10-CM | POA: Diagnosis not present

## 2016-11-13 DIAGNOSIS — J3489 Other specified disorders of nose and nasal sinuses: Secondary | ICD-10-CM | POA: Diagnosis not present

## 2016-11-13 MED ORDER — IPRATROPIUM-ALBUTEROL 0.5-2.5 (3) MG/3ML IN SOLN
3.0000 mL | Freq: Once | RESPIRATORY_TRACT | Status: AC
  Administered 2016-11-13: 3 mL via RESPIRATORY_TRACT

## 2016-11-13 NOTE — Discharge Instructions (Signed)
Sent to ED

## 2016-11-13 NOTE — ED Triage Notes (Signed)
Pt has been having a cold for 1 week and states it's getting worse. Productive cough, chest pain from coughing, headache, ears clogged up but no fever and has tried a ton of otc meds without relief. Also complains of body aches and chills.

## 2016-11-13 NOTE — ED Provider Notes (Signed)
[facility]    CSN: [id number] Arrival date & time: 11/13/16  1045     History   Chief Complaint Chief Complaint  Patient presents with  . URI    HPI Stuart Ware is a 71 y.o. male.   CC: cough, x for week, worsening Sinus pressure, HA,  chest is 'burning' and ribs are sore from coughing, bodyaches.  Describes headache as worsening, "worse headache of his life' on left side of face. Feels stabbing pain in temple. Endorses dizziness.   No fever, chills, chest pain, palpitations.   Tried alsezlter plus, otc cough medication with no relief. Coughs thorough day.   Uses albuterol, symbicort, sprivia - not really helping cough.   H/o COPD, chf; no recent hospitalization or prednisone use per patient.   H/o hearing loss bilateral       Past Medical History:  Diagnosis Date  . Adenomatous polyp of colon   . Anxiety   . Cancer ([place])    skin  . CHL (conductive hearing loss)   . Chronic otitis media   . COPD (chronic obstructive pulmonary disease) ([place])   . Diabetes mellitus without complication ([place])   . GERD (gastroesophageal reflux disease)   . Hypercholesteremia   . Melanoma of shoulder ([place])   . Nicotine abuse   . Paroxysmal supraventricular tachycardia ([place])   . Partial deafness   . PVC (premature ventricular contraction)   . Sleep apnea   . SNHL (sensorineural hearing loss)     There are no active problems to display for this patient.   Past Surgical History:  Procedure Laterality Date  . CARDIAC CATHETERIZATION    . COLONOSCOPY    . INNER EAR SURGERY Left    and tympanoplasty  . LARYNGOSCOPY    . shave biopsy Right    shoulder melanoma  . SHOULDER ARTHROSCOPY WITH ROTATOR CUFF REPAIR Right   . SKIN BIOPSY         Home Medications    Prior to Admission medications   Medication Sig Start Date End Date Taking? Authorizing Provider  Albuterol Sulfate 108 (90 Base) MCG/ACT AEPB Inhale into the lungs.   Yes [provider]  aspirin EC 81 MG tablet Take 81 mg by mouth daily.   Yes [provider]  budesonide-formoterol (SYMBICORT) 160-4.5 MCG/ACT inhaler Inhale 2 puffs into the lungs 2 (two) times daily.   Yes [provider]  ciprofloxacin (CIPRO) 500 MG tablet Take 1 tablet (500 mg total) by mouth every 12 (twelve) hours. 09/14/15  Yes Paulina Fusi, MD  enalapril (VASOTEC) 20 MG tablet Take 20 mg by mouth daily.   Yes [provider]  gabapentin (NEURONTIN) 300 MG capsule Take 300 mg by mouth 3 (three) times daily.   Yes [provider]  HYDROcodone-acetaminophen (NORCO/VICODIN) 5-325 MG tablet Take 1 tablet by mouth every 6 (six) hours as needed for moderate pain.   Yes [provider]  metFORMIN (GLUCOPHAGE) 500 MG tablet Take by mouth 2 (two) times daily with a meal.   Yes [provider]  metoprolol succinate (TOPROL-XL) 50 MG 24 hr tablet Take 50 mg by mouth daily. Take with or immediately following a meal.   Yes [provider]  naproxen (NAPROSYN) 500 MG tablet Take 500 mg by mouth 2 (two) times daily with a meal.   Yes [provider]  omeprazole (PRILOSEC) 20 MG capsule Take 20 mg by mouth daily.   Yes [provider]  simvastatin (ZOCOR)  80 MG tablet Take 80 mg by mouth daily.   Yes [provider]  tiotropium (SPIRIVA) 18 MCG inhalation capsule Place 18 mcg into inhaler and inhale daily.   Yes [provider]    Family History No family history on file.  Social History Social History  Substance Use Topics  . Smoking status: Former Research scientist (life sciences)  . Smokeless tobacco: Never Used  . Alcohol use No     Allergies   Penicillins   Review of Systems Review of Systems  Constitutional: Negative for chills and fever.  HENT: Positive for congestion.   Respiratory: Positive for cough and wheezing. Negative for shortness of breath.   Cardiovascular: Negative for chest pain and palpitations.    Gastrointestinal: Negative for nausea and vomiting.  Neurological: Positive for dizziness and headaches.     Physical Exam Triage Vital Signs ED Triage Vitals  Enc Vitals Group     BP 11/13/16 1106 (!) 138/52     Pulse Rate 11/13/16 1106 80     Resp 11/13/16 1106 18     Temp 11/13/16 1106 98.2 F (36.8 C)     Temp Source 11/13/16 1106 Oral     SpO2 11/13/16 1106 96 %     Weight 11/13/16 1105 200 lb (90.7 kg)     Height --      Head Circumference --      Peak Flow --      Pain Score 11/13/16 1105 8     Pain Loc --      Pain Edu? --      Excl. in [place]? --    No data found.   Updated Vital Signs BP 131/77 (BP Location: Right Arm)   Pulse 85   Temp 98 F (36.7 C) (Oral)   Resp 18   Wt 200 lb (90.7 kg)   SpO2 94%   BMI 25.68 kg/m   Visual Acuity Right Eye Distance:   Left Eye Distance:   Bilateral Distance:    Right Eye Near:   Left Eye Near:    Bilateral Near:     Physical Exam  Constitutional: Vital signs are normal. He appears well-developed and well-nourished.  HENT:  Head: Normocephalic and atraumatic.  Right Ear: Hearing, tympanic membrane, external ear and ear canal normal. No drainage, swelling or tenderness. Tympanic membrane is not injected, not erythematous and not bulging. No middle ear effusion. No decreased hearing is noted.  Left Ear: Hearing, tympanic membrane, external ear and ear canal normal. No drainage, swelling or tenderness. Tympanic membrane is not injected, not erythematous and not bulging.  No middle ear effusion. No decreased hearing is noted.  Nose: Nose normal. Right sinus exhibits no maxillary sinus tenderness and no frontal sinus tenderness. Left sinus exhibits no maxillary sinus tenderness and no frontal sinus tenderness.  Mouth/Throat: Uvula is midline, oropharynx is clear and moist and mucous membranes are normal. No oropharyngeal exudate, posterior oropharyngeal edema, posterior oropharyngeal erythema or tonsillar abscesses.  Eyes:  Pupils are equal, round, and reactive to light. Conjunctivae, EOM and lids are normal. Lids are everted and swept, no foreign bodies found.  Normal fundus bilaterally.  Cardiovascular: Regular rhythm and normal heart sounds.   Pulmonary/Chest: Effort normal. No respiratory distress. He has decreased breath sounds in the right lower field and the left lower field. He has no wheezes. He has no rhonchi. He has no rales.  Lymphadenopathy:       Head (right side): No submental, no submandibular, no tonsillar, no  preauricular, no posterior auricular and no occipital adenopathy present.       Head (left side): No submental, no submandibular, no tonsillar, no preauricular, no posterior auricular and no occipital adenopathy present.    He has no cervical adenopathy.  Neurological: He is alert. He has normal strength. No cranial nerve deficit or sensory deficit. He displays a negative Romberg sign.  Reflex Scores:      Bicep reflexes are 2+ on the right side and 2+ on the left side.      Patellar reflexes are 2+ on the right side and 2+ on the left side. Grip equal and strong bilateral upper extremities. Gait strong and steady. Able to perform rapid alternating movement without difficulty.  Skin: Skin is warm and dry.  Psychiatric: He has a normal mood and affect. His speech is normal and behavior is normal.  Vitals reviewed.  Patient states no improvement after albuterol treatment. Lung sounds slightly increased   UC Treatments / Results  Labs (all labs ordered are listed, but only abnormal results are displayed) Labs Reviewed - No data to display  EKG  EKG Interpretation None       Radiology No results found.  Procedures Procedures (including critical care time)  Medications Ordered in UC Medications  ipratropium-albuterol (DUONEB) 0.5-2.5 (3) MG/3ML nebulizer solution 3 mL (3 mLs Nebulization Given 11/13/16 1149)     Initial Impression / Assessment and Plan / UC Course  I have  reviewed the triage vital signs and the nursing notes.  Pertinent labs & imaging results that were available during my care of the patient were reviewed by me and considered in my medical decision making (see chart for details).      Final Clinical Impressions(s) / UC Diagnoses   Final diagnoses:  Acute intractable headache, unspecified headache type  Cough   As patient has been in clinic, he describes headache as progressively worsening. He also endorses dizziness. NO gross neurologic deficits however he does not look well. He did not improve after nebulizer treatment. Gates in pain and keeps pointing to right side of head. No history of headaches or strokehis headache on the right side. I'm concerned as patient describes HA as worse of his life. His blood pressure is well-controlled. At this time, patient, wife and I jointly agreed the emergency room as appropriate next step. patient stable at time when EMS arrived. New Prescriptions New Prescriptions   No medications on file     Controlled Substance Prescriptions [place] Controlled Substance Registry consulted? Not Applicable   Burnard Hawthorne, FNP 11/13/16 1308

## 2016-12-20 ENCOUNTER — Telehealth: Payer: Self-pay

## 2016-12-20 NOTE — Telephone Encounter (Signed)
SENT NOTES TO SCHEDULING 

## 2017-01-12 ENCOUNTER — Telehealth (HOSPITAL_COMMUNITY): Payer: Self-pay | Admitting: Internal Medicine

## 2017-01-14 ENCOUNTER — Other Ambulatory Visit: Payer: Self-pay | Admitting: Adult Health

## 2017-01-14 DIAGNOSIS — Q211 Atrial septal defect, unspecified: Secondary | ICD-10-CM

## 2017-01-14 NOTE — Telephone Encounter (Signed)
Patient returned my call on 01/14/17 and he stated that he did not want to have the test done here, he would like it done in [place]. I gave him to number to that office and informed him that his paperwork would be faxed to that office. He voiced understanding.

## 2017-01-26 ENCOUNTER — Ambulatory Visit
Admission: RE | Admit: 2017-01-26 | Discharge: 2017-01-26 | Disposition: A | Discharge status: Home / Self Care | Payer: Non-veteran care | Source: Ambulatory Visit | Attending: Adult Health | Admitting: Adult Health

## 2017-01-26 DIAGNOSIS — I340 Nonrheumatic mitral (valve) insufficiency: Secondary | ICD-10-CM | POA: Insufficient documentation

## 2017-01-26 DIAGNOSIS — R0600 Dyspnea, unspecified: Secondary | ICD-10-CM | POA: Diagnosis not present

## 2017-01-26 DIAGNOSIS — R931 Abnormal findings on diagnostic imaging of heart and coronary circulation: Secondary | ICD-10-CM | POA: Insufficient documentation

## 2017-01-26 DIAGNOSIS — Q211 Atrial septal defect, unspecified: Secondary | ICD-10-CM

## 2017-01-26 DIAGNOSIS — I499 Cardiac arrhythmia, unspecified: Secondary | ICD-10-CM | POA: Diagnosis not present

## 2017-01-26 NOTE — Progress Notes (Signed)
*  PRELIMINARY RESULTS* Echocardiogram 2D Echocardiogram has been performed.  Sherrie Sport 01/26/2017, 11:39 AM

## 2017-03-29 ENCOUNTER — Ambulatory Visit
Admission: EM | Admit: 2017-03-29 | Discharge: 2017-03-29 | Disposition: A | Discharge status: Other Acute Inpatient Hospital | Payer: Medicare Other | Attending: Family Medicine | Admitting: Family Medicine

## 2017-03-29 ENCOUNTER — Other Ambulatory Visit: Payer: Self-pay

## 2017-03-29 DIAGNOSIS — G4730 Sleep apnea, unspecified: Secondary | ICD-10-CM | POA: Diagnosis not present

## 2017-03-29 DIAGNOSIS — I471 Supraventricular tachycardia: Secondary | ICD-10-CM | POA: Insufficient documentation

## 2017-03-29 DIAGNOSIS — I493 Ventricular premature depolarization: Secondary | ICD-10-CM | POA: Diagnosis not present

## 2017-03-29 DIAGNOSIS — K219 Gastro-esophageal reflux disease without esophagitis: Secondary | ICD-10-CM | POA: Diagnosis not present

## 2017-03-29 DIAGNOSIS — Z880 Allergy status to penicillin: Secondary | ICD-10-CM | POA: Diagnosis not present

## 2017-03-29 DIAGNOSIS — R531 Weakness: Secondary | ICD-10-CM | POA: Insufficient documentation

## 2017-03-29 DIAGNOSIS — J449 Chronic obstructive pulmonary disease, unspecified: Secondary | ICD-10-CM | POA: Insufficient documentation

## 2017-03-29 DIAGNOSIS — E7800 Pure hypercholesterolemia, unspecified: Secondary | ICD-10-CM | POA: Insufficient documentation

## 2017-03-29 DIAGNOSIS — F419 Anxiety disorder, unspecified: Secondary | ICD-10-CM | POA: Diagnosis not present

## 2017-03-29 DIAGNOSIS — I509 Heart failure, unspecified: Secondary | ICD-10-CM | POA: Insufficient documentation

## 2017-03-29 DIAGNOSIS — R05 Cough: Secondary | ICD-10-CM

## 2017-03-29 DIAGNOSIS — R0981 Nasal congestion: Secondary | ICD-10-CM | POA: Diagnosis not present

## 2017-03-29 DIAGNOSIS — H905 Unspecified sensorineural hearing loss: Secondary | ICD-10-CM | POA: Diagnosis not present

## 2017-03-29 DIAGNOSIS — E119 Type 2 diabetes mellitus without complications: Secondary | ICD-10-CM | POA: Insufficient documentation

## 2017-03-29 DIAGNOSIS — I959 Hypotension, unspecified: Secondary | ICD-10-CM

## 2017-03-29 DIAGNOSIS — J111 Influenza due to unidentified influenza virus with other respiratory manifestations: Secondary | ICD-10-CM | POA: Insufficient documentation

## 2017-03-29 DIAGNOSIS — I110 Hypertensive heart disease with heart failure: Secondary | ICD-10-CM | POA: Insufficient documentation

## 2017-03-29 DIAGNOSIS — M791 Myalgia, unspecified site: Secondary | ICD-10-CM | POA: Diagnosis not present

## 2017-03-29 DIAGNOSIS — R42 Dizziness and giddiness: Secondary | ICD-10-CM | POA: Diagnosis not present

## 2017-03-29 DIAGNOSIS — Z85820 Personal history of malignant melanoma of skin: Secondary | ICD-10-CM | POA: Insufficient documentation

## 2017-03-29 DIAGNOSIS — Z87891 Personal history of nicotine dependence: Secondary | ICD-10-CM | POA: Diagnosis not present

## 2017-03-29 DIAGNOSIS — R079 Chest pain, unspecified: Secondary | ICD-10-CM | POA: Diagnosis not present

## 2017-03-29 DIAGNOSIS — R69 Illness, unspecified: Secondary | ICD-10-CM

## 2017-03-29 NOTE — ED Provider Notes (Signed)
[facility] ____________________________________________  Time seen: Approximately 10:17 AM  I have reviewed the triage vital signs and the nursing notes.   HISTORY  Chief Complaint Cough   HPI Stuart Ware is a 72 y.o. male history of COPD, PVCs, PACs, congestive heart failure, hypertension, hyperlipidemia, presenting for evaluation of 2.5 days of quick onset cough, congestion, chills, body aches and weakness.  Patient states that he feels very tired and having accompanying dizziness.  Denies known fevers, but reports subjectively positive for fevers.  Has taken some over-the-counter cough congestion medication.  No over-the-counter antipyretics taken just prior to arrival.  Did take daily medications this morning including blood pressure medicines.  Reports he has been so tired of the last 2 days he has not eaten anything and barely drink.  Patient states currently he feels more fatigued than anything else.  States cough was disrupting sleep last night and states cough is a deep cough but overall nonproductive.  Reports wife recently sick with some similar complaints.  Denies syncope or near syncope.  States some soreness from coughing, denies chest pain or shortness of breath.  Denies other aggravating or alleviating factors.  Denies recent sickness.  PCP At [facility]   Past Medical History:  Diagnosis Date  . Adenomatous polyp of colon   . Anxiety   . Cancer ([place])    skin  . CHL (conductive hearing loss)   . Chronic otitis media   . COPD (chronic obstructive pulmonary disease) ([place])   . Diabetes mellitus without complication ([place])   . GERD (gastroesophageal reflux disease)   . Hypercholesteremia   . Melanoma of shoulder ([place])   . Nicotine abuse   . Paroxysmal supraventricular tachycardia ([place])   . Partial deafness   . PVC (premature ventricular contraction)   . Sleep apnea   . SNHL (sensorineural hearing loss)     There are no active problems to display for  this patient.   Past Surgical History:  Procedure Laterality Date  . CARDIAC CATHETERIZATION    . COLONOSCOPY    . INNER EAR SURGERY Left    and tympanoplasty  . LARYNGOSCOPY    . shave biopsy Right    shoulder melanoma  . SHOULDER ARTHROSCOPY WITH ROTATOR CUFF REPAIR Right   . SKIN BIOPSY       No current facility-administered medications for this encounter.   Current Outpatient Medications:  .  Albuterol Sulfate 108 (90 Base) MCG/ACT AEPB, Inhale into the lungs., Disp: , Rfl:  .  aspirin EC 81 MG tablet, Take 81 mg by mouth daily., Disp: , Rfl:  .  budesonide-formoterol (SYMBICORT) 160-4.5 MCG/ACT inhaler, Inhale 2 puffs into the lungs 2 (two) times daily., Disp: , Rfl:  .  enalapril (VASOTEC) 20 MG tablet, Take 20 mg by mouth daily., Disp: , Rfl:  .  gabapentin (NEURONTIN) 300 MG capsule, Take 300 mg by mouth 3 (three) times daily., Disp: , Rfl:  .  HYDROcodone-acetaminophen (NORCO/VICODIN) 5-325 MG tablet, Take 1 tablet by mouth every 6 (six) hours as needed for moderate pain., Disp: , Rfl:  .  metFORMIN (GLUCOPHAGE) 500 MG tablet, Take by mouth 2 (two) times daily with a meal., Disp: , Rfl:  .  naproxen (NAPROSYN) 500 MG tablet, Take 500 mg by mouth 2 (two) times daily with a meal., Disp: , Rfl:  .  omeprazole (PRILOSEC) 20 MG capsule, Take 20 mg by mouth daily., Disp: , Rfl:  .  simvastatin (ZOCOR) 80 MG tablet, Take 80  mg by mouth daily., Disp: , Rfl:  .  tiotropium (SPIRIVA) 18 MCG inhalation capsule, Place 18 mcg into inhaler and inhale daily., Disp: , Rfl:  .  ciprofloxacin (CIPRO) 500 MG tablet, Take 1 tablet (500 mg total) by mouth every 12 (twelve) hours., Disp: 14 tablet, Rfl: 0 .  metoprolol succinate (TOPROL-XL) 50 MG 24 hr tablet, Take 50 mg by mouth daily. Take with or immediately following a meal., Disp: , Rfl:   Allergies Penicillins  Family History  Problem Relation Age of Onset  . COPD Mother   . Heart disease Father     Social History Social History     Tobacco Use  . Smoking status: Former Research scientist (life sciences)  . Smokeless tobacco: Never Used  Substance Use Topics  . Alcohol use: No  . Drug use: No    Review of Systems Constitutional:  as above Eyes: No visual changes. ENT: Reports scratchy throat Cardiovascular: Denies chest pain. Respiratory: Denies shortness of breath. Gastrointestinal: No abdominal pain.  no vomiting.  No diarrhea.  No constipation. Musculoskeletal: Negative for back pain. Skin: Negative for rash.   ____________________________________________   PHYSICAL EXAM:  VITAL SIGNS: ED Triage Vitals  Enc Vitals Group     BP 03/29/17 0937 (!) 87/46     Pulse Rate 03/29/17 0937 66     Resp 03/29/17 0937 18     Temp 03/29/17 0937 98.1 F (36.7 C)     Temp Source 03/29/17 0937 Oral     SpO2 03/29/17 0937 94 %     Weight 03/29/17 0934 200 lb (90.7 kg)     Height 03/29/17 0934 6\' 2"  (1.88 m)     Head Circumference --      Peak Flow --      Pain Score 03/29/17 0934 7     Pain Loc --      Pain Edu? --      Excl. in [place]? --    Vitals:   03/29/17 0934 03/29/17 0937  BP:  (!) 87/46  Pulse:  66  Resp:  18  Temp:  98.1 F (36.7 C)  TempSrc:  Oral  SpO2:  94%  Weight: 200 lb (90.7 kg)   Height: 6\' 2"  (1.88 m)    Orthostatic VS for the past 24 hrs:  BP- Lying Pulse- Lying BP- Sitting Pulse- Sitting BP- Standing at 0 minutes Pulse- Standing at 0 minutes  03/29/17 0954 90/52 50 94/49 (!) 42 (!) 70/46 (!) 43     Constitutional: Alert and oriented. Well appearing and in no acute distress. Eyes: Conjunctivae are normal.  ENT      Head: Normocephalic and atraumatic.      Ears: Nontender, no erythema, normal TMs bilaterally.      Nose: No congestion/rhinnorhea.      Mouth/Throat: Mucous membranes are moist.Oropharynx non-erythematous. Neck: No stridor. Supple without meningismus.  Hematological/Lymphatic/Immunilogical: No cervical lymphadenopathy. Cardiovascular: Bradycardia.  Irregular rhythm.  Good peripheral  circulation. Respiratory: Normal respiratory effort without tachypnea nor retractions. Breath sounds are clear and equal bilaterally. No wheezes, rales, rhonchi.  Intermittent cough noted in room.  Speaks in complete sentences. Gastrointestinal: Soft and nontender.  Musculoskeletal: Steady gait. Neurologic:  Normal speech and language. No gross focal neurologic deficits are appreciated. Speech is normal. No gait instability.  Skin:  Skin is warm, dry and intact. No rash noted. Psychiatric: Mood and affect are normal. Speech and behavior are normal. Patient exhibits appropriate insight and judgment.   ___________________________________________   LABS (all  labs ordered are listed, but only abnormal results are displayed)  Labs Reviewed - No data to display ____________________________________________  EKG  ED ECG REPORT I, Marylene Land, the attending provider, personally viewed and interpreted this ECG.   Date: 03/29/2017  EKG Time: 1006  Rate: 81  Rhythm: sinus rhythm   Axis: left axis deviation  Intervals:incomplete right bundle branch block with occasional PVCs and PACs  Similar to previous ecg on 03/29/11, as well as dictated ecg careeverywhere 11/13/16  ____________________________________________  RADIOLOGY  No results found. ____________________________________________   PROCEDURES Procedures   INITIAL IMPRESSION / ASSESSMENT AND PLAN / ED COURSE  Pertinent labs & imaging results that were available during my care of the patient were reviewed by me and considered in my medical decision making (see chart for details).  Presenting for evaluation of influenza-like symptoms with quick onset with accompanying weakness and dizziness.  Patient noted to be hypotensive in urgent care with positive orthostatics as well as a variable heart rate.  Patient alert and oriented decisional capacity.  Recommend EMS transfer to ER for further evaluation and fluids, suspect  dehydration.  Patient wife brought back to room and explained the same, wife request to have EMS transport patient to [place].  Patient did not want IV initiated until EMS arrived to determine where transport to.Discussed dependency on EMS as to where to transport, EMS called.  EMS states they will transport patient to the [place] in [place].  Patient agrees to this plan.  ____________________________________________   FINAL CLINICAL IMPRESSION(S) / ED DIAGNOSES  Final diagnoses:  Influenza-like illness  Weakness  Hypotension, unspecified hypotension type     ED Discharge Orders    None       Note: This dictation was prepared with Dragon dictation along with smaller phrase technology. Any transcriptional errors that result from this process are unintentional.         Marylene Land, NP 03/29/17 1204

## 2017-03-29 NOTE — ED Triage Notes (Signed)
Patient complains of cough, congestion, body aches, chills that started 2 days ago.

## 2018-03-28 DIAGNOSIS — R0602 Shortness of breath: Secondary | ICD-10-CM | POA: Diagnosis not present

## 2018-03-28 DIAGNOSIS — I4891 Unspecified atrial fibrillation: Secondary | ICD-10-CM | POA: Diagnosis not present

## 2018-03-28 DIAGNOSIS — R609 Edema, unspecified: Secondary | ICD-10-CM | POA: Diagnosis not present

## 2018-03-28 DIAGNOSIS — R079 Chest pain, unspecified: Secondary | ICD-10-CM | POA: Diagnosis not present

## 2018-08-28 ENCOUNTER — Other Ambulatory Visit: Payer: Self-pay

## 2021-03-18 ENCOUNTER — Other Ambulatory Visit: Payer: Self-pay

## 2021-03-18 ENCOUNTER — Ambulatory Visit
Admission: EM | Admit: 2021-03-18 | Discharge: 2021-03-18 | Disposition: A | Discharge status: Home / Self Care | Payer: Medicare Other | Attending: Internal Medicine | Admitting: Internal Medicine

## 2021-03-18 ENCOUNTER — Ambulatory Visit (INDEPENDENT_AMBULATORY_CARE_PROVIDER_SITE_OTHER): Payer: Medicare Other

## 2021-03-18 DIAGNOSIS — M25512 Pain in left shoulder: Secondary | ICD-10-CM | POA: Diagnosis not present

## 2021-03-18 DIAGNOSIS — W19XXXA Unspecified fall, initial encounter: Secondary | ICD-10-CM

## 2021-03-18 DIAGNOSIS — S51012A Laceration without foreign body of left elbow, initial encounter: Secondary | ICD-10-CM | POA: Diagnosis not present

## 2021-03-18 DIAGNOSIS — S43402A Unspecified sprain of left shoulder joint, initial encounter: Secondary | ICD-10-CM

## 2021-03-18 NOTE — ED Triage Notes (Signed)
Patient is here @ [facility] due to Fall. Fell in yard "today". Hurting Left shoulder, arm. Abrasions on left elbow. No head injury.

## 2021-03-18 NOTE — Discharge Instructions (Signed)
Daily wound dressing changes with topical antibiotic ointment If you notice redness spreading away from the laceration site, increased discharge, bleeding, worsening pain-please return to urgent care immediately to be reevaluated Return to urgent care in 3 days for wound check Gentle range of motion exercises of the left shoulder Take pain medications as prescribed for back pain X-rays negative for fracture of the left shoulder Return to urgent care if symptoms worsen.

## 2021-03-18 NOTE — ED Provider Notes (Signed)
[facility]    CSN: [id number] Arrival date & time: 03/18/21  1026      History   Chief Complaint Chief Complaint  Patient presents with   Fall    HPI Stuart Ware is a 76 y.o. male comes to the urgent care with left shoulder and left elbow pain which started after he fell today.  The fall was mechanical.  Patient tripped and fell whilst doing some yard work.  Patient describes pain as severe, sharp, aggravated by movement and somewhat relieved by rest.  No radiation of pain.  No swelling.  Patient sustained laceration over the left elbow.  Bleeding is controlled at this time.  No numbness or tingling in the fingers.  Patient denies hitting his head.  No confusion, vomiting or difficulty finding his words.   HPI  Past Medical History:  Diagnosis Date   Adenomatous polyp of colon    Anxiety    Cancer ([place])    skin   CHL (conductive hearing loss)    Chronic otitis media    COPD (chronic obstructive pulmonary disease) ([facility])    Diabetes mellitus without complication ([facility])    GERD (gastroesophageal reflux disease)    Hypercholesteremia    Melanoma of shoulder ([facility])    Nicotine abuse    Paroxysmal supraventricular tachycardia ([facility])    Partial deafness    PVC (premature ventricular contraction)    Sleep apnea    SNHL (sensorineural hearing loss)     There are no problems to display for this patient.   Past Surgical History:  Procedure Laterality Date   CARDIAC CATHETERIZATION     COLONOSCOPY     INNER EAR SURGERY Left    and tympanoplasty   LARYNGOSCOPY     shave biopsy Right    shoulder melanoma   SHOULDER ARTHROSCOPY WITH ROTATOR CUFF REPAIR Right    SKIN BIOPSY         Home Medications    Prior to Admission medications   Medication Sig Start Date End Date Taking? Authorizing Provider  Albuterol Sulfate 108 (90 Base) MCG/ACT AEPB Inhale into the lungs.   Yes [provider]  aspirin EC 81 MG tablet Take 81 mg by mouth daily.   Yes  [provider]  budesonide-formoterol (SYMBICORT) 160-4.5 MCG/ACT inhaler Inhale 2 puffs into the lungs 2 (two) times daily.   Yes [provider]  enalapril (VASOTEC) 20 MG tablet Take 20 mg by mouth daily.   Yes [provider]  gabapentin (NEURONTIN) 300 MG capsule Take 300 mg by mouth 3 (three) times daily.   Yes [provider]  metFORMIN (GLUCOPHAGE) 500 MG tablet Take by mouth 2 (two) times daily with a meal.   Yes [provider]  metoprolol succinate (TOPROL-XL) 50 MG 24 hr tablet Take 50 mg by mouth daily. Take with or immediately following a meal.   Yes [provider]  naproxen (NAPROSYN) 500 MG tablet Take 500 mg by mouth 2 (two) times daily with a meal.   Yes [provider]  omeprazole (PRILOSEC) 20 MG capsule Take 20 mg by mouth daily.   Yes [provider]  simvastatin (ZOCOR) 80 MG tablet Take 80 mg by mouth daily.   Yes [provider]  tiotropium (SPIRIVA) 18 MCG inhalation capsule Place 18 mcg into inhaler and inhale daily.   Yes [provider]  ciprofloxacin (CIPRO) 500 MG tablet Take 1 tablet (500 mg total) by mouth every 12 (twelve) hours.  09/14/15   Paulina Fusi, MD  HYDROcodone-acetaminophen (NORCO/VICODIN) 5-325 MG tablet Take 1 tablet by mouth every 6 (six) hours as needed for moderate pain.    [provider]    Family History Family History  Problem Relation Age of Onset   COPD Mother    Heart disease Father     Social History Social History   Tobacco Use   Smoking status: Former   Smokeless tobacco: Never  Scientific laboratory technician Use: Never used  Substance Use Topics   Alcohol use: No   Drug use: No     Allergies   Penicillins   Review of Systems Review of Systems  HENT: Negative.    Respiratory: Negative.    Cardiovascular:  Negative for chest pain.  Gastrointestinal: Negative.   Genitourinary: Negative.   Musculoskeletal:  Positive for  arthralgias and myalgias. Negative for joint swelling.  Skin:  Positive for color change and wound.  Neurological: Negative.     Physical Exam Triage Vital Signs ED Triage Vitals  Enc Vitals Group     BP 03/18/21 1037 110/70     Pulse Rate 03/18/21 1037 70     Resp 03/18/21 1037 20     Temp 03/18/21 1037 98 F (36.7 C)     Temp Source 03/18/21 1037 Oral     SpO2 03/18/21 1037 100 %     Weight 03/18/21 1035 199 lb 15.3 oz (90.7 kg)     Height --      Head Circumference --      Peak Flow --      Pain Score 03/18/21 1035 10     Pain Loc --      Pain Edu? --      Excl. in [place]? --    No data found.  Updated Vital Signs BP 110/70 (BP Location: Right Arm)    Pulse 70    Temp 98 F (36.7 C) (Oral)    Resp 20    Wt 90.7 kg    SpO2 100%    BMI 25.67 kg/m   Visual Acuity Right Eye Distance:   Left Eye Distance:   Bilateral Distance:    Right Eye Near:   Left Eye Near:    Bilateral Near:     Physical Exam Vitals and nursing note reviewed.  Constitutional:      General: He is in acute distress.  HENT:     Head: Normocephalic and atraumatic.     Nose: Nose normal.     Mouth/Throat:     Mouth: Mucous membranes are moist.  Eyes:     Pupils: Pupils are equal, round, and reactive to light.  Cardiovascular:     Rate and Rhythm: Normal rate and regular rhythm.  Pulmonary:     Effort: Pulmonary effort is normal.     Breath sounds: Normal breath sounds.  Musculoskeletal:     Comments: Tenderness on palpation over the left elbow and left shoulder.  Range of motion of the left shoulder is limited secondary to pain.  No bruising noted.  No swelling noted.  Skin:    Comments: Curvilinear laceration over the left elbow.  Laceration measures about 10 cm in its entirety.  Bleeding is controlled.  Skin flap is dusky looking.  Neurological:     General: No focal deficit present.     Mental Status: He is alert and oriented to person, place, and time.     UC Treatments / Results   Labs (  all labs ordered are listed, but only abnormal results are displayed) Labs Reviewed - No data to display  EKG   Radiology DG Shoulder Left  Result Date: 03/18/2021 CLINICAL DATA:  Left shoulder pain after a fall, initial encounter. EXAM: LEFT SHOULDER - 2+ VIEW COMPARISON:  None. FINDINGS: No fracture or dislocation. Left humeral head is slightly high riding. Visualized portion of the left chest is grossly unremarkable. IMPRESSION: 1. No fracture or dislocation. 2. Left humeral head is somewhat high riding, raising suspicion for a chronic rotator cuff tear. Electronically Signed   By: Lorin Picket M.D.   On: 03/18/2021 11:42    Procedures Laceration Repair  Date/Time: 03/19/2021 8:32 AM Performed by: Chase Picket, MD Authorized by: Chase Picket, MD   Consent:    Consent obtained:  Verbal   Consent given by:  Patient   Risks discussed:  Infection and poor cosmetic result   Alternatives discussed:  No treatment and delayed treatment Universal protocol:    Patient identity confirmed:  Verbally with patient Anesthesia:    Anesthesia method:  Local infiltration   Local anesthetic:  Lidocaine 1% WITH epi Laceration details:    Location:  Shoulder/arm   Shoulder/arm location:  L elbow   Length (cm):  10   Depth (mm):  5 Pre-procedure details:    Preparation:  Patient was prepped and draped in usual sterile fashion Exploration:    Limited defect created (wound extended): no     Imaging outcome: foreign body not noted     Wound exploration: wound explored through full range of motion   Treatment:    Area cleansed with:  Povidone-iodine   Amount of cleaning:  Extensive   Debridement:  Minimal   Undermining:  None   Scar revision: no   Skin repair:    Repair method:  Sutures   Suture size:  4-0   Suture material:  Prolene   Suture technique:  Simple interrupted   Number of sutures:  11 Approximation:    Approximation:  Loose Repair type:    Repair type:   Intermediate Post-procedure details:    Dressing:  Non-adherent dressing and antibiotic ointment   Procedure completion:  Tolerated well, no immediate complications (including critical care time)  Medications Ordered in UC Medications - No data to display  Initial Impression / Assessment and Plan / UC Course  I have reviewed the triage vital signs and the nursing notes.  Pertinent labs & imaging results that were available during my care of the patient were reviewed by me and considered in my medical decision making (see chart for details).     1.  Left shoulder sprain: X-ray of the left shoulder is negative for fracture Continue taking pain medications as prescribed Gentle range of motion exercises Return to urgent care if symptoms worsen  2.  Laceration over the left elbow: Laceration repair completed Daily wound dressing changes with antibiotic ointment Return to urgent care in 3 days for wound check If you notice redness, swelling, purulent discharge or worsening pain please return to urgent care immediately to be reevaluated. Final Clinical Impressions(s) / UC Diagnoses   Final diagnoses:  Sprain of left shoulder, unspecified shoulder sprain type, initial encounter  Laceration of skin of left elbow, initial encounter     Discharge Instructions      Daily wound dressing changes with topical antibiotic ointment If you notice redness spreading away from the laceration site, increased discharge, bleeding, worsening pain-please return to urgent care  immediately to be reevaluated Return to urgent care in 3 days for wound check Gentle range of motion exercises of the left shoulder Take pain medications as prescribed for back pain X-rays negative for fracture of the left shoulder Return to urgent care if symptoms worsen.     ED Prescriptions   None    PDMP not reviewed this encounter.   Chase Picket, MD 03/19/21 [phone]

## 2021-03-19 DIAGNOSIS — S51012A Laceration without foreign body of left elbow, initial encounter: Secondary | ICD-10-CM

## 2021-03-21 ENCOUNTER — Ambulatory Visit
Admission: EM | Admit: 2021-03-21 | Discharge: 2021-03-21 | Disposition: A | Discharge status: Home / Self Care | Payer: Medicare Other

## 2021-03-21 ENCOUNTER — Other Ambulatory Visit: Payer: Self-pay

## 2021-03-21 DIAGNOSIS — S51012D Laceration without foreign body of left elbow, subsequent encounter: Secondary | ICD-10-CM

## 2021-03-21 DIAGNOSIS — Z5189 Encounter for other specified aftercare: Secondary | ICD-10-CM

## 2021-03-21 NOTE — ED Provider Notes (Addendum)
[facility]    CSN: [id number] Arrival date & time: 03/21/21  0804      History   Chief Complaint Chief Complaint  Patient presents with   Wound Check    HPI Stuart Ware is a 76 y.o. male presenting for wound check - L elbow. Was last at the urgent care 2/22, 11 sutures were applied, no abx sent. Tdap UTD within 5 years.  history diabetes, former smoker.  He does take long-term anticoagulation.  States the wound seems to be healing okay, but it is still seeping some blood.  He is changing the bandage as directed.  States he still cannot move the left shoulder, and [facility] was normal.  Denies fever/chills, discharge from the wound.  He is not immunocompromised.  HPI  Past Medical History:  Diagnosis Date   Adenomatous polyp of colon    Anxiety    Cancer ([place])    skin   CHL (conductive hearing loss)    Chronic otitis media    COPD (chronic obstructive pulmonary disease) (HCC)    Diabetes mellitus without complication (HCC)    GERD (gastroesophageal reflux disease)    Hypercholesteremia    Melanoma of shoulder (HCC)    Nicotine abuse    Paroxysmal supraventricular tachycardia (HCC)    Partial deafness    PVC (premature ventricular contraction)    Sleep apnea    SNHL (sensorineural hearing loss)     There are no problems to display for this patient.   Past Surgical History:  Procedure Laterality Date   CARDIAC CATHETERIZATION     COLONOSCOPY     INNER EAR SURGERY Left    and tympanoplasty   LARYNGOSCOPY     shave biopsy Right    shoulder melanoma   SHOULDER ARTHROSCOPY WITH ROTATOR CUFF REPAIR Right    SKIN BIOPSY         Home Medications    Prior to Admission medications   Medication Sig Start Date End Date Taking? Authorizing Provider  Albuterol Sulfate 108 (90 Base) MCG/ACT AEPB Inhale into the lungs.    [provider]  aspirin EC 81 MG tablet Take 81 mg by mouth daily.     [provider]  budesonide-formoterol (SYMBICORT) 160-4.5 MCG/ACT inhaler Inhale 2 puffs into the lungs 2 (two) times daily.    [provider]  ciprofloxacin (CIPRO) 500 MG tablet Take 1 tablet (500 mg total) by mouth every 12 (twelve) hours. 09/14/15   Paulina Fusi, MD  enalapril (VASOTEC) 20 MG tablet Take 20 mg by mouth daily.    [provider]  gabapentin (NEURONTIN) 300 MG capsule Take 300 mg by mouth 3 (three) times daily.    [provider]  HYDROcodone-acetaminophen (NORCO/VICODIN) 5-325 MG tablet Take 1 tablet by mouth every 6 (six) hours as needed for moderate pain.    [provider]  metFORMIN (GLUCOPHAGE) 500 MG tablet Take by mouth 2 (two) times daily with a meal.    [provider]  metoprolol succinate (TOPROL-XL) 50 MG 24 hr tablet Take 50 mg by mouth daily. Take with or immediately following a meal.    [provider]  naproxen (NAPROSYN) 500 MG tablet Take 500 mg by mouth 2 (two) times daily with a meal.    [provider]  omeprazole (PRILOSEC) 20 MG capsule Take 20 mg by mouth daily.    [provider]  simvastatin (ZOCOR)  80 MG tablet Take 80 mg by mouth daily.    [provider]  tiotropium (SPIRIVA) 18 MCG inhalation capsule Place 18 mcg into inhaler and inhale daily.    [provider]    Family History Family History  Problem Relation Age of Onset   COPD Mother    Heart disease Father     Social History Social History   Tobacco Use   Smoking status: Former   Smokeless tobacco: Never  Scientific laboratory technician Use: Never used  Substance Use Topics   Alcohol use: No   Drug use: No     Allergies   Penicillins   Review of Systems Review of Systems  Skin:  Positive for wound.  All other systems reviewed and are negative.   Physical Exam Triage Vital Signs ED Triage Vitals [03/21/21 0809]  Enc Vitals Group     BP      Pulse      Resp      Temp       Temp src      SpO2      Weight 199 lb 15.3 oz (90.7 kg)     Height      Head Circumference      Peak Flow      Pain Score      Pain Loc      Pain Edu?      Excl. in [place]?    No data found.  Updated Vital Signs BP 118/78 (BP Location: Left Arm)    Pulse 67    Temp 97.9 F (36.6 C) (Oral)    Resp 18    Wt 199 lb 15.3 oz (90.7 kg)    SpO2 100%    BMI 25.67 kg/m   Visual Acuity Right Eye Distance:   Left Eye Distance:   Bilateral Distance:    Right Eye Near:   Left Eye Near:    Bilateral Near:     Physical Exam Vitals reviewed.  Constitutional:      General: He is not in acute distress.    Appearance: Normal appearance. He is not ill-appearing.  HENT:     Head: Normocephalic and atraumatic.  Pulmonary:     Effort: Pulmonary effort is normal.  Skin:    Comments: See image below.  11 sutures in place. There is minimal bleeding. No warmth, erythema, discharge. Radial pulse 2+, cap refill <2 seconds.  Neurological:     General: No focal deficit present.     Mental Status: He is alert and oriented to person, place, and time.  Psychiatric:        Mood and Affect: Mood normal.        Behavior: Behavior normal.        Thought Content: Thought content normal.        Judgment: Judgment normal.      UC Treatments / Results  Labs (all labs ordered are listed, but only abnormal results are displayed) Labs Reviewed - No data to display  EKG   Radiology No results found.  Procedures Procedures (including critical care time)  Medications Ordered in UC Medications - No data to display  Initial Impression / Assessment and Plan / UC Course  I have reviewed the triage vital signs and the nursing notes.  Pertinent labs & imaging results that were available during my care of the patient were reviewed by me and considered in my medical decision making (see chart for details).  This patient is a very pleasant 76 y.o. year old male presenting with wound check.  We last  evaluated him on 2/22 for this, 11 sutures were placed, tetanus is up-to-date within the last 5 years.  He is not immunocompromised.  He does take long-term anticoagulation.  Wound appears to be healing well, with minimal bleeding.  New dressing placed by nurse.  [facility] and another wound check.  Follow-up in about 5 days for suture removal.  Final Clinical Impressions(s) / UC Diagnoses   Final diagnoses:  Laceration of left elbow, subsequent encounter  Visit for wound check     Discharge Instructions      -Wash your wound with gentle soap and water 1-2 times daily.  Let air dry or gently pat.  Keep wrapped during the day or when you're doing something that could get it dirty (working, [employer], cooking, Social research officer, government). Avoid cleansing with hydrogen peroxide or alcohol!! -Seek additional medical attention if the wound is getting worse instead of better- redness increasing in size, pain getting worse, new/worsening discharge, new fevers/chills, etc.      ED Prescriptions   None    PDMP not reviewed this encounter.   Hazel Sams, PA-C 03/21/21 0843    Hazel Sams, PA-C 03/21/21 [phone]

## 2021-03-21 NOTE — ED Triage Notes (Signed)
Pt here for wound check, was seen on 03/18/2021 was told to come back in 3 days for wound check. Wound on left elbow.

## 2021-03-21 NOTE — Discharge Instructions (Addendum)
-  Wash your wound with gentle soap and water 1-2 times daily.  Let air dry or gently pat.  Keep wrapped during the day or when you're doing something that could get it dirty (working, [employer], cooking, Social research officer, government). Avoid cleansing with hydrogen peroxide or alcohol!! -Seek additional medical attention if the wound is getting worse instead of better- redness increasing in size, pain getting worse, new/worsening discharge, new fevers/chills, etc.

## 2021-03-27 ENCOUNTER — Ambulatory Visit
Admission: EM | Admit: 2021-03-27 | Discharge: 2021-03-27 | Disposition: A | Discharge status: Home / Self Care | Payer: Medicare Other | Attending: Emergency Medicine | Admitting: Emergency Medicine

## 2021-03-27 ENCOUNTER — Other Ambulatory Visit: Payer: Self-pay

## 2021-03-27 VITALS — BP 124/90 | HR 79 | Temp 98.2°F | Resp 15 | Ht 74.0 in | Wt 200.0 lb

## 2021-03-27 DIAGNOSIS — Z4802 Encounter for removal of sutures: Secondary | ICD-10-CM

## 2021-03-27 NOTE — ED Triage Notes (Signed)
Patient here to have sutures removed from his left elbow.  Patient denies any complaints. ?

## 2021-10-06 ENCOUNTER — Encounter: Payer: Self-pay | Admitting: Emergency Medicine

## 2021-10-06 ENCOUNTER — Ambulatory Visit
Admission: EM | Admit: 2021-10-06 | Discharge: 2021-10-06 | Disposition: A | Discharge status: Other Acute Inpatient Hospital | Payer: Medicare Other | Attending: Physician Assistant | Admitting: Physician Assistant

## 2021-10-06 ENCOUNTER — Emergency Department: Payer: No Typology Code available for payment source

## 2021-10-06 ENCOUNTER — Ambulatory Visit (INDEPENDENT_AMBULATORY_CARE_PROVIDER_SITE_OTHER): Payer: Medicare Other

## 2021-10-06 ENCOUNTER — Other Ambulatory Visit: Payer: Self-pay

## 2021-10-06 ENCOUNTER — Emergency Department
Admission: EM | Admit: 2021-10-06 | Discharge: 2021-10-06 | Disposition: A | Discharge status: Home / Self Care | Payer: No Typology Code available for payment source | Attending: Emergency Medicine | Admitting: Emergency Medicine

## 2021-10-06 DIAGNOSIS — R197 Diarrhea, unspecified: Secondary | ICD-10-CM | POA: Diagnosis present

## 2021-10-06 DIAGNOSIS — I4891 Unspecified atrial fibrillation: Secondary | ICD-10-CM

## 2021-10-06 DIAGNOSIS — Z85828 Personal history of other malignant neoplasm of skin: Secondary | ICD-10-CM | POA: Diagnosis not present

## 2021-10-06 DIAGNOSIS — J449 Chronic obstructive pulmonary disease, unspecified: Secondary | ICD-10-CM

## 2021-10-06 DIAGNOSIS — R531 Weakness: Secondary | ICD-10-CM | POA: Diagnosis not present

## 2021-10-06 DIAGNOSIS — E860 Dehydration: Secondary | ICD-10-CM | POA: Insufficient documentation

## 2021-10-06 DIAGNOSIS — E119 Type 2 diabetes mellitus without complications: Secondary | ICD-10-CM | POA: Diagnosis not present

## 2021-10-06 DIAGNOSIS — R1013 Epigastric pain: Secondary | ICD-10-CM | POA: Insufficient documentation

## 2021-10-06 DIAGNOSIS — Z79899 Other long term (current) drug therapy: Secondary | ICD-10-CM | POA: Diagnosis not present

## 2021-10-06 DIAGNOSIS — Z20822 Contact with and (suspected) exposure to covid-19: Secondary | ICD-10-CM | POA: Diagnosis not present

## 2021-10-06 DIAGNOSIS — R059 Cough, unspecified: Secondary | ICD-10-CM

## 2021-10-06 DIAGNOSIS — R0602 Shortness of breath: Secondary | ICD-10-CM | POA: Diagnosis not present

## 2021-10-06 LAB — SARS CORONAVIRUS 2 BY RT PCR: SARS Coronavirus 2 by RT PCR: NEGATIVE

## 2021-10-06 LAB — COMPREHENSIVE METABOLIC PANEL
ALT: 27 U/L (ref 0–44)
AST: 31 U/L (ref 15–41)
Albumin: 4 g/dL (ref 3.5–5.0)
Alkaline Phosphatase: 38 U/L (ref 38–126)
Anion gap: 12 (ref 5–15)
BUN: 28 mg/dL — ABNORMAL HIGH (ref 8–23)
CO2: 26 mmol/L (ref 22–32)
Calcium: 9.2 mg/dL (ref 8.9–10.3)
Chloride: 102 mmol/L (ref 98–111)
Creatinine, Ser: 1.13 mg/dL (ref 0.61–1.24)
GFR, Estimated: >60 mL/min (ref >60)
Glucose, Bld: 128 mg/dL — ABNORMAL HIGH (ref 70–99)
Potassium: 3.3 mmol/L — ABNORMAL LOW (ref 3.5–5.1)
Sodium: 140 mmol/L (ref 135–145)
Total Bilirubin: 1.1 mg/dL (ref 0.3–1.2)
Total Protein: 6.8 g/dL (ref 6.5–8.1)

## 2021-10-06 LAB — CBC WITH DIFFERENTIAL/PLATELET
Abs Immature Granulocytes: 0.05 10*3/uL (ref 0.00–0.07)
Basophils Absolute: 0.1 10*3/uL (ref 0.0–0.1)
Basophils Relative: 1 %
Eosinophils Absolute: 0.1 10*3/uL (ref 0.0–0.5)
Eosinophils Relative: 1 %
HCT: 49.9 % (ref 39.0–52.0)
Hemoglobin: 17.1 g/dL — ABNORMAL HIGH (ref 13.0–17.0)
Immature Granulocytes: 1 %
Lymphocytes Relative: 16 %
Lymphs Abs: 1.6 10*3/uL (ref 0.7–4.0)
MCH: 32.6 pg (ref 26.0–34.0)
MCHC: 34.3 g/dL (ref 30.0–36.0)
MCV: 95.2 fL (ref 80.0–100.0)
Monocytes Absolute: 1.1 10*3/uL — ABNORMAL HIGH (ref 0.1–1.0)
Monocytes Relative: 11 %
Neutro Abs: 7.2 10*3/uL (ref 1.7–7.7)
Neutrophils Relative %: 70 %
Platelets: 198 10*3/uL (ref 150–400)
RBC: 5.24 MIL/uL (ref 4.22–5.81)
RDW: 13.2 % (ref 11.5–15.5)
WBC: 10 10*3/uL (ref 4.0–10.5)
nRBC: 0 % (ref 0.0–0.2)

## 2021-10-06 LAB — TROPONIN I (HIGH SENSITIVITY): Troponin I (High Sensitivity): 13 ng/L (ref <18)

## 2021-10-06 LAB — MAGNESIUM: Magnesium: 2.1 mg/dL (ref 1.7–2.4)

## 2021-10-06 MED ORDER — POTASSIUM CHLORIDE CRYS ER 20 MEQ PO TBCR
40.0000 meq | EXTENDED_RELEASE_TABLET | Freq: Once | ORAL | Status: AC
  Administered 2021-10-06: 40 meq via ORAL
  Filled 2021-10-06: qty 2

## 2021-10-06 MED ORDER — IOHEXOL 300 MG/ML  SOLN
100.0000 mL | Freq: Once | INTRAMUSCULAR | Status: AC | PRN
  Administered 2021-10-06: 100 mL via INTRAVENOUS

## 2021-10-06 MED ORDER — LACTATED RINGERS IV BOLUS
1000.0000 mL | Freq: Once | INTRAVENOUS | Status: AC
  Administered 2021-10-06: 1000 mL via INTRAVENOUS

## 2021-10-06 MED ORDER — PREGABALIN 50 MG PO CAPS: 100.0000 mg | ORAL_CAPSULE | Freq: Two times a day (BID) | ORAL | 0 refills | Status: AC

## 2021-10-06 NOTE — ED Triage Notes (Signed)
Patient to ED via [facility] from UC for weakness w/ N/V/D x1 week. New onset afib noted at UC with rate between 80-120. Patient Aox4 denies CP.

## 2021-10-06 NOTE — Discharge Instructions (Addendum)

## 2021-10-06 NOTE — Discharge Instructions (Addendum)
Please make sure you are staying hydrated.  Please take in bland solids until you are feeling better or just liquids.  Sent your prescription for Lyrica to your pharmacy.  Please start at just 50 mg twice a day and then after a week you can increase to 100 mg twice a day.

## 2021-10-06 NOTE — ED Notes (Signed)
EMS at bedside

## 2021-10-06 NOTE — ED Triage Notes (Signed)
Patient presents to UC for vomiting, SOB, diarrhea, chills, and cough since a week ago. Has been taking nausea pills and albuterol inhaler. No improvements.  Last dose about 1200 today. Hx of emphysema.   Denies chest pain

## 2021-10-06 NOTE — ED Notes (Signed)
EMS called and report given by [place], CMA.

## 2021-10-06 NOTE — ED Provider Notes (Signed)
[facility] Provider Note    Event Date/Time   First MD Initiated Contact with Patient 10/06/21 1600     (approximate)   History   Weakness   HPI  Stuart Ware is a 76 y.o. male past medical history of COPD, paroxysmal SVT, PVCs, sleep apnea who presents with weakness, vomiting diarrhea x1 week.  Patient has had multiple episodes of diarrhea about 10/day for the last week.  1 episode of emesis the entire week.  Endorses decreased appetite with pain in the center of his abdomen worse after eating.  He feels generally weak and fatigued.  Also feels somewhat short of breath denies chest pain no cough.  Has felt very hot and then cold no measured fevers.  Denies lower extremity swelling.  He went to urgent care today and was found to have new A-fib so was told to come to the emergency department.     Past Medical History:  Diagnosis Date   Adenomatous polyp of colon    Anxiety    Cancer ([place])    skin   CHL (conductive hearing loss)    Chronic otitis media    COPD (chronic obstructive pulmonary disease) (HCC)    Diabetes mellitus without complication (HCC)    GERD (gastroesophageal reflux disease)    Hypercholesteremia    Melanoma of shoulder (HCC)    Nicotine abuse    Paroxysmal supraventricular tachycardia (HCC)    Partial deafness    PVC (premature ventricular contraction)    Sleep apnea    SNHL (sensorineural hearing loss)     There are no problems to display for this patient.    Physical Exam  Triage Vital Signs: ED Triage Vitals  Enc Vitals Group     BP --      Pulse --      Resp --      Temp --      Temp src --      SpO2 --      Weight 10/06/21 1559 174 lb (78.9 kg)     Height 10/06/21 1559 '6\' 2"'$  (1.88 m)     Head Circumference --      Peak Flow --      Pain Score 10/06/21 1558 0     Pain Loc --      Pain Edu? --      Excl. in [place]? --     Most recent vital signs: Vitals:   10/06/21 1630 10/06/21 1645  BP: 122/83   Pulse:  (!) 49 80  Resp: 14 (!) 23  Temp:    SpO2: 97% 94%     General: Awake, no distress.  CV:  Good peripheral perfusion. No edema Resp:  Normal effort. No wheezing, nml lung sounds Abd:  No distention. Ttp in the epigastric region  Neuro:             Awake, Alert, Oriented x 3  Other:  Dry MM   ED Results / Procedures / Treatments  Labs (all labs ordered are listed, but only abnormal results are displayed) Labs Reviewed  COMPREHENSIVE METABOLIC PANEL - Abnormal; Notable for the following components:      Result Value   Potassium 3.3 (*)    Glucose, Bld 128 (*)    BUN 28 (*)    All other components within normal limits  CBC WITH DIFFERENTIAL/PLATELET - Abnormal; Notable for the following components:   Hemoglobin 17.1 (*)    Monocytes Absolute 1.1 (*)  All other components within normal limits  SARS CORONAVIRUS 2 BY RT PCR  MAGNESIUM  BRAIN NATRIURETIC PEPTIDE  TROPONIN I (HIGH SENSITIVITY)     EKG  EKG reviewed interpreted by myself shows atrial fibrillation with right bundle branch block left axis deviation PVCs   RADIOLOGY Reviewed chest x-ray performed at urgent care and interpreted which is negative for infiltrate   PROCEDURES:  Critical Care performed: No  .1-3 Lead EKG Interpretation  Performed by: Rada Hay, MD Authorized by: Rada Hay, MD     Interpretation: abnormal     ECG rate assessment: tachycardic     Rhythm: atrial fibrillation     Ectopy: none     Conduction: normal     The patient is on the cardiac monitor to evaluate for evidence of arrhythmia and/or significant heart rate changes.   MEDICATIONS ORDERED IN ED: Medications  potassium chloride SA (KLOR-CON M) CR tablet 40 mEq (has no administration in time range)  lactated ringers bolus 1,000 mL (1,000 mLs Intravenous New Bag/Given 10/06/21 1623)  iohexol (OMNIPAQUE) 300 MG/ML solution 100 mL (100 mLs Intravenous Contrast Given 10/06/21 1653)     IMPRESSION / MDM /  ASSESSMENT AND PLAN / ED COURSE  I reviewed the triage vital signs and the nursing notes.                              Patient's presentation is most consistent with acute presentation with potential threat to life or bodily function.  Differential diagnosis includes, but is not limited to, hypovolemia, electrolyte abnormality, renal failure, anemia, gastroenteritis, ulcer, pancreatitis, ACS  The patient is a 76 year old male who presents with weakness vomiting diarrhea x1 week.  Went to urgent care was found to have A-fib which is apparently new so was referred to the ED.  On arrival patient's heart rate is in the 90s looks to be A-fib and blood pressure is normal.  He looks well overall.  He has had multiple episodes of diarrhea over the last week just 1 episode of vomiting and is having epigastric pain that is worse after eating.  Also feels generally weak and short of breath but denies chest pain denies cough.  Does endorse feeling hot and cold no measured fevers.  Lungs are clear he is mildly tender in the epigastric region but abdomen overall is soft.  EKG shows A-fib with a right bundle branch block.  Had a right bundle branch block and left anterior fascicular block in the past but I do not see documented history of A-fib.  Do see document history of paroxysmal SVT and I see that he is on Eliquis.  I was able to gain access to patient's [facility] cardiology notes.  He does have a documented history of A-fib is not on Eliquis.  His metoprolol for rate control.  Patient's labs are overall reassuring.  He has no leukocytosis.  Calcium is mildly low at 3.3.  BUN mildly increased to 28.  At 1.13 from baseline of around 0.8.  Magnesium is normal.  Troponin is negative.  CT abdomen pelvis obtained shows liquid stool in the colon but no acute intra-abdominal process.  Patient feeling improved on reassessment.  Overall I suspect that he is somewhat volume depleted in the having of having diarrhea and poor p.o.  intake.  Heart rate improved after a liter of fluid.  Discussed adequate fluid intake and bland diet while he is still having  diarrhea.  [facility]  He is out of his Lyrica so we will send 30-day prescription for this.  We will start at lower dose and titrate up given he has been off it for about 2 weeks.     FINAL CLINICAL IMPRESSION(S) / ED DIAGNOSES   Final diagnoses:  Dehydration  Diarrhea, unspecified type     Rx / DC Orders   ED Discharge Orders          Ordered    pregabalin (LYRICA) 50 MG capsule  2 times daily        10/06/21 1755             Note:  This document was prepared using Dragon voice recognition software and may include unintentional dictation errors.   Rada Hay, MD 10/06/21 [phone]

## 2021-10-06 NOTE — ED Notes (Signed)
Patient is being discharged from the Urgent Care and sent to the Emergency Department via EMS . Per Wolfgang Phoenix, patient is in need of higher level of care due to new onset Afib with RVR. Patient is aware and verbalizes understanding of plan of care.  Vitals:   10/06/21 1408  BP: (!) 128/106  Pulse: 93  Resp: (!) 28  Temp: 97.7 F (36.5 C)  SpO2: 98%

## 2021-10-06 NOTE — ED Provider Notes (Signed)
[facility]    CSN: [id number] Arrival date & time: 10/06/21  1402      History   Chief Complaint Chief Complaint  Patient presents with   Emesis   Diarrhea   Weakness   Shortness of Breath    HPI Stuart Ware is a 76 y.o. male presenting with his stepdaughter today for 1 week history of nausea/vomiting, diarrhea, fatigue, chills, cough, shortness of breath.  He has a history of emphysema.  He has been using his albuterol inhaler and also taking antiemetics without improvement in symptoms.  They report recent worsening of his symptoms today.  He has had increased shortness of breath not responsive to his inhaler.  He also reports abdominal pain but says he has had abdominal pain for the past 1 week.  He denies chest pain, palpitations or dizziness.  They do report weakness and unsteadiness.  He does report that his doctor did not fill his Lyrica and he has been out of it for the past 1 week.  Patient goes to the [facility].  He is unsure if some of his symptoms could be related to that or not.  Other medical history significant for diabetes, hyperlipidemia, sleep apnea and previous history of SVT and PVCs.  He has no medical history significant for atrial fibrillation.  HPI  Past Medical History:  Diagnosis Date   Adenomatous polyp of colon    Anxiety    Cancer ([place])    skin   CHL (conductive hearing loss)    Chronic otitis media    COPD (chronic obstructive pulmonary disease) (HCC)    Diabetes mellitus without complication (HCC)    GERD (gastroesophageal reflux disease)    Hypercholesteremia    Melanoma of shoulder (HCC)    Nicotine abuse    Paroxysmal supraventricular tachycardia (HCC)    Partial deafness    PVC (premature ventricular contraction)    Sleep apnea    SNHL (sensorineural hearing loss)     There are no problems to display for this patient.   Past Surgical History:  Procedure Laterality Date   CARDIAC CATHETERIZATION      COLONOSCOPY     INNER EAR SURGERY Left    and tympanoplasty   LARYNGOSCOPY     shave biopsy Right    shoulder melanoma   SHOULDER ARTHROSCOPY WITH ROTATOR CUFF REPAIR Right    SKIN BIOPSY         Home Medications    Prior to Admission medications   Medication Sig Start Date End Date Taking? Authorizing Provider  Albuterol Sulfate 108 (90 Base) MCG/ACT AEPB Inhale into the lungs.    [provider]  aspirin EC 81 MG tablet Take 81 mg by mouth daily.    [provider]  budesonide-formoterol (SYMBICORT) 160-4.5 MCG/ACT inhaler Inhale 2 puffs into the lungs 2 (two) times daily.    [provider]  ciprofloxacin (CIPRO) 500 MG tablet Take 1 tablet (500 mg total) by mouth every 12 (twelve) hours. 09/14/15   Paulina Fusi, MD  enalapril (VASOTEC) 20 MG tablet Take 20 mg by mouth daily.    [provider]  gabapentin (NEURONTIN) 300 MG capsule Take 300 mg by mouth 3 (three) times daily.    [provider]  HYDROcodone-acetaminophen (NORCO/VICODIN) 5-325 MG tablet Take 1 tablet by mouth every 6 (six) hours as needed for moderate pain.    [provider]  metFORMIN (GLUCOPHAGE) 500 MG tablet Take by mouth 2 (two) times  daily with a meal.    [provider]  metoprolol succinate (TOPROL-XL) 50 MG 24 hr tablet Take 50 mg by mouth daily. Take with or immediately following a meal.    [provider]  naproxen (NAPROSYN) 500 MG tablet Take 500 mg by mouth 2 (two) times daily with a meal.    [provider]  omeprazole (PRILOSEC) 20 MG capsule Take 20 mg by mouth daily.    [provider]  simvastatin (ZOCOR) 80 MG tablet Take 80 mg by mouth daily.    [provider]  tiotropium (SPIRIVA) 18 MCG inhalation capsule Place 18 mcg into inhaler and inhale daily.    [provider]    Family History Family History  Problem Relation Age of Onset   COPD Mother    Heart disease Father     Social  History Social History   Tobacco Use   Smoking status: Former   Smokeless tobacco: Never  Scientific laboratory technician Use: Never used  Substance Use Topics   Alcohol use: No   Drug use: No     Allergies   Penicillins   Review of Systems Review of Systems  Constitutional:  Positive for fatigue. Negative for fever.  HENT:  Negative for congestion.   Respiratory:  Positive for cough and shortness of breath.   Cardiovascular:  Negative for chest pain and palpitations.  Gastrointestinal:  Positive for abdominal pain, diarrhea, nausea and vomiting. Negative for constipation.  Musculoskeletal:  Positive for gait problem (balance issues).  Neurological:  Positive for weakness. Negative for dizziness, syncope, numbness and headaches.     Physical Exam Triage Vital Signs ED Triage Vitals  Enc Vitals Group     BP 10/06/21 1408 (!) 128/106     Pulse Rate 10/06/21 1408 93     Resp 10/06/21 1408 (!) 28     Temp 10/06/21 1408 97.7 F (36.5 C)     Temp Source 10/06/21 1408 Oral     SpO2 10/06/21 1408 98 %     Weight --      Height --      Head Circumference --      Peak Flow --      Pain Score 10/06/21 1412 0     Pain Loc --      Pain Edu? --      Excl. in [place]? --    No data found.  Updated Vital Signs BP (!) 128/106 (BP Location: Left Arm)   Pulse 93   Temp 97.7 F (36.5 C) (Oral)   Resp (!) 28   SpO2 98%    Physical Exam Vitals and nursing note reviewed.  Constitutional:      General: He is not in acute distress.    Appearance: Normal appearance. He is well-developed. He is ill-appearing.  HENT:     Head: Normocephalic and atraumatic.     Nose: Nose normal.     Mouth/Throat:     Mouth: Mucous membranes are dry.     Pharynx: Oropharynx is clear.  Eyes:     General: No scleral icterus.    Conjunctiva/sclera: Conjunctivae normal.  Cardiovascular:     Rate and Rhythm: Normal rate. Rhythm irregular.     Heart sounds: No murmur heard. Pulmonary:     Effort:  Respiratory distress (increased RR) present.     Breath sounds: Normal breath sounds.  Abdominal:     Palpations: Abdomen is soft.     Tenderness: There is  abdominal tenderness (generalized).  Musculoskeletal:        General: No swelling.     Cervical back: Neck supple.  Skin:    General: Skin is warm and dry.     Capillary Refill: Capillary refill takes less than 2 seconds.  Neurological:     General: No focal deficit present.     Mental Status: He is alert and oriented to person, place, and time. Mental status is at baseline.     Motor: Weakness (generalized) present.     Gait: Gait abnormal (wide).  Psychiatric:        Mood and Affect: Mood normal.        Behavior: Behavior normal.      UC Treatments / Results  Labs (all labs ordered are listed, but only abnormal results are displayed) Labs Reviewed - No data to display  EKG   Radiology CT ABDOMEN PELVIS W CONTRAST  Result Date: 10/06/2021 CLINICAL DATA:  Abdominal pain, acute, nonlocalized. EXAM: CT ABDOMEN AND PELVIS WITH CONTRAST TECHNIQUE: Multidetector CT imaging of the abdomen and pelvis was performed using the standard protocol following bolus administration of intravenous contrast. RADIATION DOSE REDUCTION: This exam was performed according to the departmental dose-optimization program which includes automated exposure control, adjustment of the mA and/or kV according to patient size and/or use of iterative reconstruction technique. CONTRAST:  133m OMNIPAQUE IOHEXOL 300 MG/ML  SOLN COMPARISON:  None FINDINGS: Lower chest: Mild scarring at the lung bases. No acute or active process. Hepatobiliary: 4 mm cyst in the right lobe of the liver. Mild perfusion anomaly at the inferior aspect of the right lobe, not significant. No evidence of mass. No calcified gallstones or ductal dilatation. Pancreas: Normal Spleen: Normal Adrenals/Urinary Tract: Adrenal glands are normal. Kidneys are normal. No stone, mass or hydronephrosis.  Bladder is normal. Stomach/Bowel: Stomach and small intestine are normal. Normal appendix. Some liquid stool noted in the colon but without evidence colitis pattern. No diverticulosis or diverticulitis. Vascular/Lymphatic: Aortic atherosclerosis. No aneurysm. IVC is normal. No adenopathy. Reproductive: Normal favored. Ordinary appearing prostate concretions. Other: No free fluid or air. Musculoskeletal: Mild scoliosis and degenerative change of the spine. Scattered small hyperdensities within the bones of the pelvis and lumbar spine are presumed to represent benign bone islands. Sclerotic metastatic disease not excluded but not favored. IMPRESSION: Some liquid stool present within the colon. Otherwise unremarkable acute exam. No sign of enteritis or colitis. Normal appendix. Aortic atherosclerosis. Lower lumbar degenerative changes. Small sclerotic foci scattered within the bones of the pelvis and lumbar spine favored to represent benign bone islands. Not possible to exclude an early manifestation of sclerotic metastatic disease, but that is less likely, particularly if the patient does not have a history of prostate cancer. Electronically Signed   By: MNelson ChimesM.D.   On: 10/06/2021 17:12   DG Chest 2 View  Result Date: 10/06/2021 CLINICAL DATA:  Cough and shortness of breath over the last week. EXAM: CHEST - 2 VIEW COMPARISON:  04/26/2011 FINDINGS: Heart size is normal. Chronic aortic atherosclerosis. Mild chronic scarring in the lingula. On the lateral views, there is question of 1 cm nodule projected over the heart. This is not definitely seen in the frontal projection. Chest CT would be suggested for further evaluation. IMPRESSION: No sign of acute pneumonia by radiography. Mild chronic scarring in the lingula. Question 1 cm nodule projected over the heart in the lateral projection. Chest CT recommended for further evaluation. Electronically Signed   By: MNelson Chimes  M.D.   On: 10/06/2021 14:39     Procedures ED EKG  Date/Time: 10/06/2021 3:35 PM  Performed by: Danton Clap, PA-C Authorized by: Danton Clap, PA-C   Previous ECG:    Previous ECG:  Compared to current   Similarity:  Changes noted   Comparison ECG info:  Atrial fibrillation with rvr Interpretation:    Interpretation: abnormal   Rate:    ECG rate:  105   ECG rate assessment: tachycardic   Rhythm:    Rhythm: atrial fibrillation   QRS:    QRS axis:  Left   QRS intervals:  Normal   QRS conduction: RBBB   ST segments:    ST segments:  Normal T waves:    T waves: non-specific   Comments:     Atrial fibrillation with RVR, t wave abnormality  (including critical care time)  Medications Ordered in UC Medications - No data to display  Initial Impression / Assessment and Plan / UC Course  I have reviewed the triage vital signs and the nursing notes.  Pertinent labs & imaging results that were available during my care of the patient were reviewed by me and considered in my medical decision making (see chart for details).   76 year old male with history of COPD, diabetes, hyperlipidemia, OSA presents for 1 week history of nausea/vomiting/diarrhea, cough, increased shortness of breath and generalized weakness/fatigue.  Symptoms recently worsening.  BP elevated at 128/106.  Increased respiratory rate of 28.  Appears ill/fatigued.  Mild distress.  Irregular heart rhythm.  Chest clear to auscultation.  Abdomen with generalized tenderness.  Chest x-ray without signs of pneumonia.  EKG performed shows atrial fibrillation which is new.  A-fib with RVR and premature aberrantly conducted complexes.  Discussed with patient and his stepdaughter that he needs to be seen in emergency department as soon as possible and I recommend EMS transport.  He usually goes to the [facility] and asked to go there.    Suspect patient may be having withdrawal symptoms related to the Lyrica.  Questionable electrolyte  abnormalities, dehydration if he has had significant vomiting and diarrhea.  This may lead to the atrial fibrillation.  IV access gained by nursing staff.  Report given to EMS.  Patient in stable condition.   Final Clinical Impressions(s) / UC Diagnoses   Final diagnoses:  Atrial fibrillation with RVR (HCC)  Shortness of breath  Generalized weakness  History of ongoing treatment with high-risk medication  Chronic obstructive pulmonary disease, unspecified COPD type [facility])     Discharge Instructions      You have been advised to follow up immediately in the emergency department for concerning signs.symptoms. If you declined EMS transport, please have a family member take you directly to the ED at this time. Do not delay. Based on concerns about condition, if you do not follow up in th e ED, you may risk poor outcomes including worsening of condition, delayed treatment and potentially life threatening issues. If you have declined to go to the ED at this time, you should call your PCP immediately to set up a follow up appointment.  Go to ED for red flag symptoms, including; fevers you cannot reduce with Tylenol/Motrin, severe headaches, vision changes, numbness/weakness in part of the body, lethargy, confusion, intractable vomiting, severe dehydration, chest pain, breathing difficulty, severe persistent abdominal or pelvic pain, signs of severe infection (increased redness, swelling of an area), feeling faint or passing out, dizziness, etc. You should especially go to  the ED for sudden acute worsening of condition if you do not elect to go at this time.      ED Prescriptions   None    PDMP not reviewed this encounter.   Danton Clap, PA-C 10/06/21 1754

## 2023-09-09 IMAGING — CR DG SHOULDER 2+V*L*
4 series · 4 of 4 positions shown · non-contrast
Comparison: None.

CLINICAL DATA: Left shoulder pain after a fall, initial encounter.

EXAM:
LEFT SHOULDER - 2+ VIEW

[shoulder grashey]
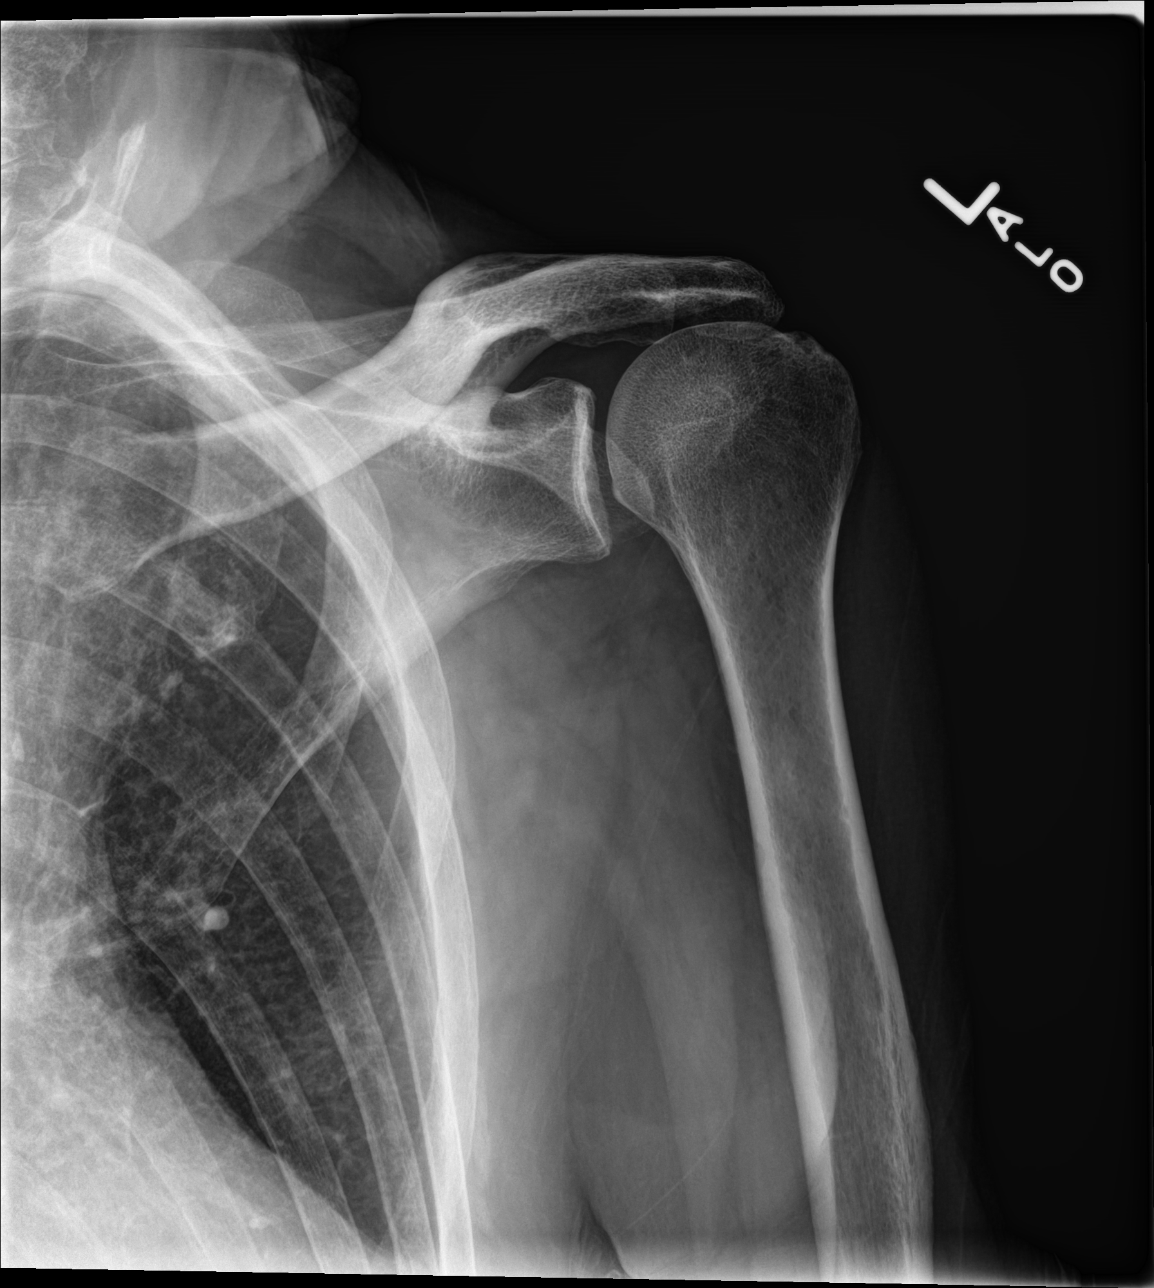

[shoulder y view]
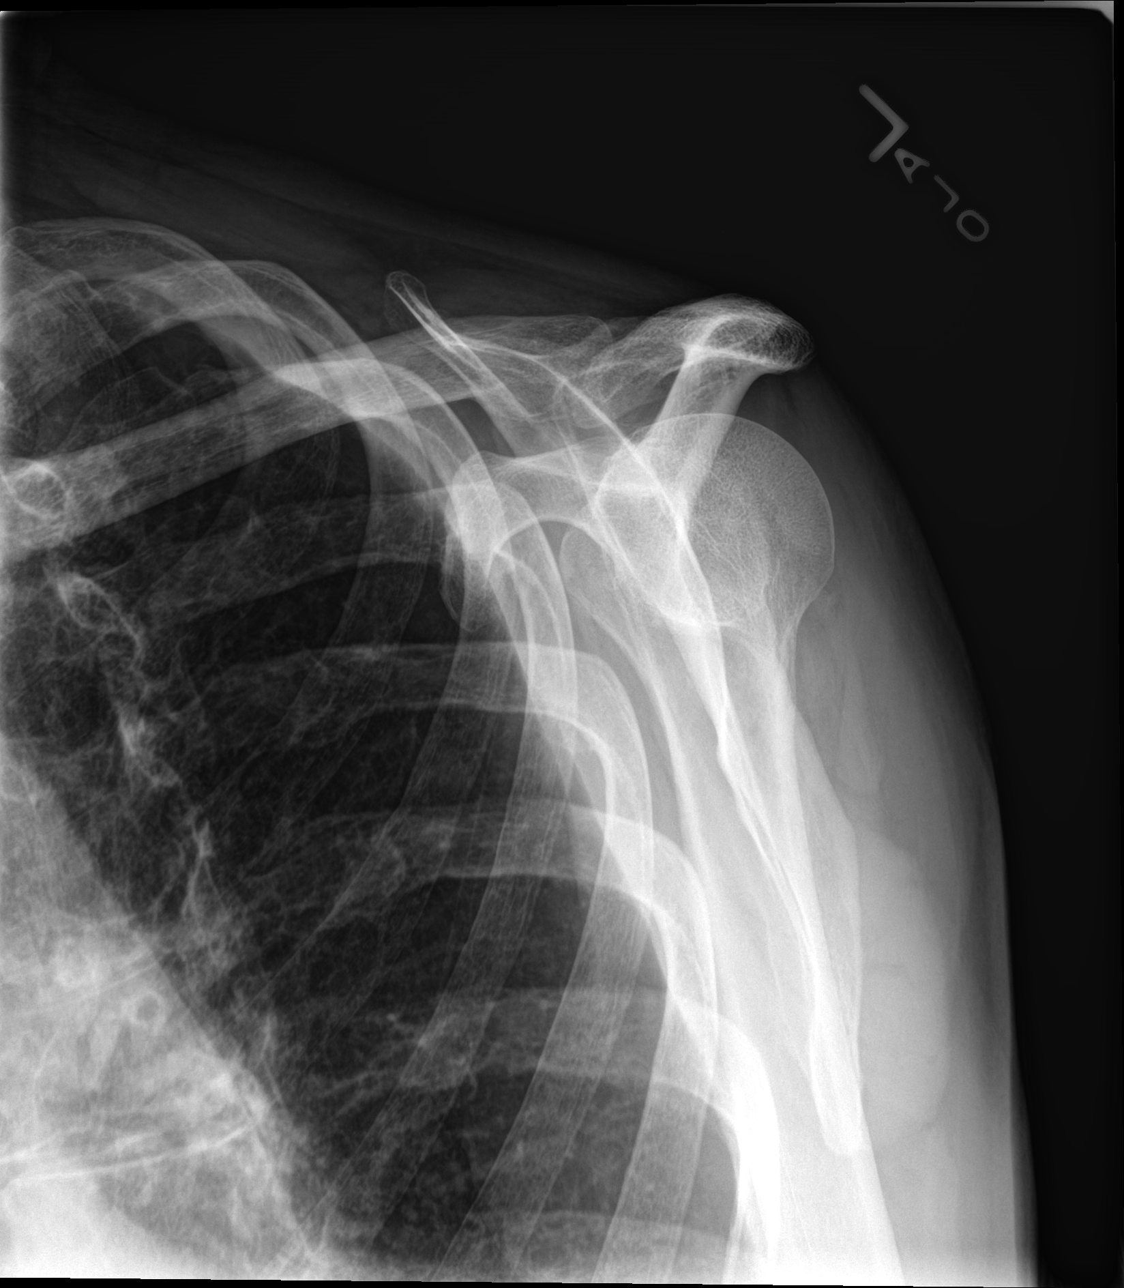

[shoulder axial (1 of 2)]
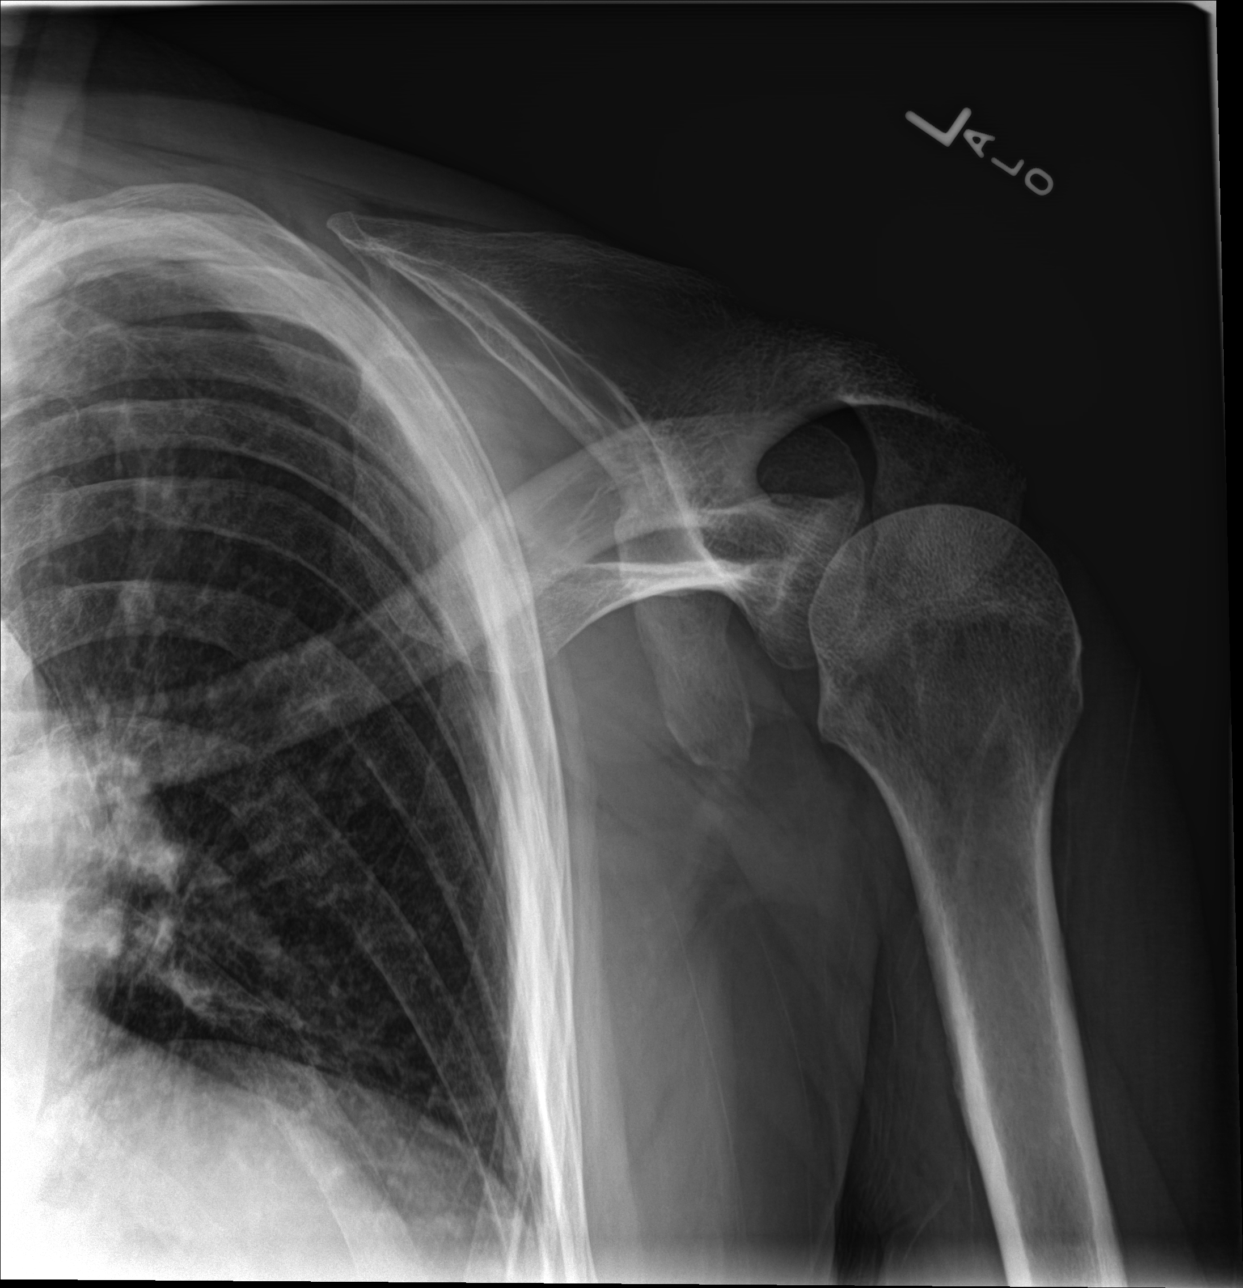

[shoulder axial (2 of 2)]
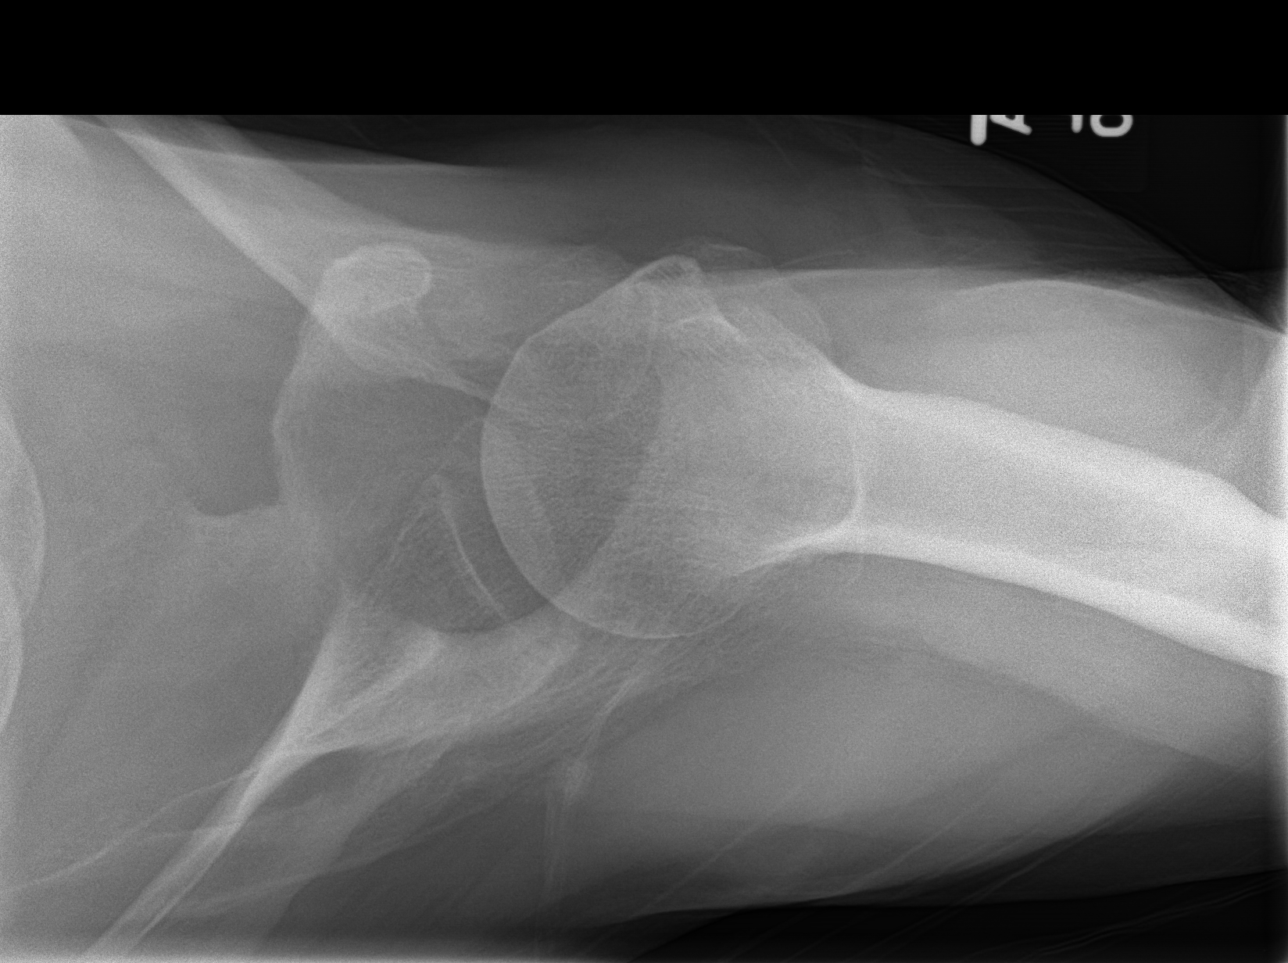

[4 of 4 positions shown; findings below may reference images not displayed]

FINDINGS: No fracture or dislocation. Left humeral head is slightly high
riding. Visualized portion of the left chest is grossly
unremarkable.
IMPRESSION: 1. No fracture or dislocation.
2. Left humeral head is somewhat high riding, raising suspicion for
a chronic rotator cuff tear.

## 2023-09-16 ENCOUNTER — Ambulatory Visit
Admission: EM | Admit: 2023-09-16 | Discharge: 2023-09-16 | Disposition: A | Discharge status: Home / Self Care | Attending: Family Medicine | Admitting: Family Medicine

## 2023-09-16 ENCOUNTER — Encounter: Payer: Self-pay | Admitting: Emergency Medicine

## 2023-09-16 DIAGNOSIS — W57XXXA Bitten or stung by nonvenomous insect and other nonvenomous arthropods, initial encounter: Secondary | ICD-10-CM | POA: Diagnosis not present

## 2023-09-16 DIAGNOSIS — R2233 Localized swelling, mass and lump, upper limb, bilateral: Secondary | ICD-10-CM

## 2023-09-16 DIAGNOSIS — M7989 Other specified soft tissue disorders: Secondary | ICD-10-CM | POA: Diagnosis not present

## 2023-09-16 MED ORDER — TRIAMCINOLONE ACETONIDE 0.1 % EX OINT: 1.0000 | TOPICAL_OINTMENT | Freq: Two times a day (BID) | CUTANEOUS | 0 refills | Status: AC

## 2023-09-16 MED ORDER — DOXYCYCLINE HYCLATE 100 MG PO CAPS: 100.0000 mg | ORAL_CAPSULE | Freq: Two times a day (BID) | ORAL | 0 refills | Status: AC

## 2023-09-16 MED ORDER — PREDNISONE 20 MG PO TABS: 40.0000 mg | ORAL_TABLET | Freq: Every day | ORAL | 0 refills | Status: AC

## 2023-09-16 NOTE — ED Triage Notes (Signed)
 Patient states that the got bite by something yesterday on his right hand and left forearm.  Patient has redness, swelling and pain in his right hand and wrist and also in his left forearm.  Patient denies fevers.  Patient has only used OTC hydrocortisone cream to the areas for the itchiness.  Patient denies any tongue or throat swelling or SOB.

## 2023-09-16 NOTE — Discharge Instructions (Signed)
 Stop by the pharmacy to pick up your prescriptions.  Follow up with your primary care provider or return to the urgent care, if not improving.

## 2023-09-16 NOTE — ED Provider Notes (Signed)
 [facility]    CSN: [id number] Arrival date & time: 09/16/23  9060      History   Chief Complaint Chief Complaint  Patient presents with   Insect Bite    Right hand and left forearm    HPI Stuart Ware is a 78 y.o. male.   HPI  History from: patient. Stuart Ware is a 78 y.o. male who reports getting bit by an insect while cleaning out his flower bed. Has been applying cortisone and chigger bite relief without relief.  Felt a burning sensation after it bit him then it got red and started swelling up.  Was bitten on the right hand with swelling into right forearm and then on left forearm. The rash itches and burns.  Does report  redness at site of bite. No headaches, nausea, vomiting, fever, visual changes, extremity edema reported. Ambulatory without difficulty.       Past Medical History:  Diagnosis Date   Adenomatous polyp of colon    Anxiety    Cancer (HCC)    skin   CHL (conductive hearing loss)    Chronic otitis media    COPD (chronic obstructive pulmonary disease) (HCC)    Diabetes mellitus without complication (HCC)    GERD (gastroesophageal reflux disease)    Hypercholesteremia    Melanoma of shoulder (HCC)    Nicotine abuse    Paroxysmal supraventricular tachycardia (HCC)    Partial deafness    PVC (premature ventricular contraction)    Sleep apnea    SNHL (sensorineural hearing loss)     There are no active problems to display for this patient.   Past Surgical History:  Procedure Laterality Date   CARDIAC CATHETERIZATION     COLONOSCOPY     INNER EAR SURGERY Left    and tympanoplasty   LARYNGOSCOPY     shave biopsy Right    shoulder melanoma   SHOULDER ARTHROSCOPY WITH ROTATOR CUFF REPAIR Right    SKIN BIOPSY         Home Medications    Prior to Admission medications   Medication Sig Start Date End Date Taking? Authorizing Provider  doxycycline  (VIBRAMYCIN ) 100 MG capsule Take 1 capsule (100 mg total) by mouth 2 (two)  times daily. 09/16/23  Yes Stuart Maue, DO  predniSONE  (DELTASONE ) 20 MG tablet Take 2 tablets (40 mg total) by mouth daily for 3 days. 09/16/23 09/19/23 Yes Stuart Hellinger, DO  triamcinolone  ointment (KENALOG ) 0.1 % Apply 1 Application topically 2 (two) times daily. 09/16/23  Yes Stuart Joslin, DO  Albuterol  Sulfate 108 (90 Base) MCG/ACT AEPB Inhale into the lungs.    [provider]  aspirin EC 81 MG tablet Take 81 mg by mouth daily.    [provider]  budesonide-formoterol (SYMBICORT) 160-4.5 MCG/ACT inhaler Inhale 2 puffs into the lungs 2 (two) times daily.    [provider]  enalapril (VASOTEC) 20 MG tablet Take 20 mg by mouth daily.    [provider]  gabapentin (NEURONTIN) 300 MG capsule Take 300 mg by mouth 3 (three) times daily.    [provider]  HYDROcodone-acetaminophen (NORCO/VICODIN) 5-325 MG tablet Take 1 tablet by mouth every 6 (six) hours as needed for moderate pain.    [provider]  metFORMIN (GLUCOPHAGE) 500 MG tablet Take by mouth 2 (two) times daily with a meal.    [provider]  metoprolol succinate (TOPROL-XL) 50 MG 24 hr tablet Take 50 mg by mouth daily. Take  with or immediately following a meal.    [provider]  naproxen (NAPROSYN) 500 MG tablet Take 500 mg by mouth 2 (two) times daily with a meal.    [provider]  omeprazole (PRILOSEC) 20 MG capsule Take 20 mg by mouth daily.    [provider]  pregabalin  (LYRICA ) 50 MG capsule Take 2 capsules (100 mg total) by mouth 2 (two) times daily. 10/06/21 11/05/21  Clide Burnard Ee, MD  simvastatin (ZOCOR) 80 MG tablet Take 80 mg by mouth daily.    [provider]  tiotropium (SPIRIVA) 18 MCG inhalation capsule Place 18 mcg into inhaler and inhale daily.    [provider]    Family History Family History  Problem Relation Age of Onset   COPD Mother    Heart disease Father     Social History Social  History   Tobacco Use   Smoking status: Former   Smokeless tobacco: Never  Advertising account planner   Vaping status: Never Used  Substance Use Topics   Alcohol use: No   Drug use: No     Allergies   Penicillins   Review of Systems Review of Systems : :negative unless otherwise stated in HPI.      Physical Exam Triage Vital Signs ED Triage Vitals  Encounter Vitals Group     BP 09/16/23 1004 116/70     Girls Systolic BP Percentile --      Girls Diastolic BP Percentile --      Boys Systolic BP Percentile --      Boys Diastolic BP Percentile --      Pulse Rate 09/16/23 1004 68     Resp 09/16/23 1004 16     Temp 09/16/23 1004 97.7 F (36.5 C)     Temp Source 09/16/23 1004 Oral     SpO2 09/16/23 1004 95 %     Weight 09/16/23 1002 185 lb (83.9 kg)     Height 09/16/23 1002 6' 2 (1.88 m)     Head Circumference --      Peak Flow --      Pain Score 09/16/23 1002 8     Pain Loc --      Pain Education --      Exclude from Growth Chart --    No data found.  Updated Vital Signs BP 116/70 (BP Location: Right Arm)   Pulse 68   Temp 97.7 F (36.5 C) (Oral)   Resp 16   Ht 6' 2 (1.88 m)   Wt 83.9 kg   SpO2 95%   BMI 23.75 kg/m   Visual Acuity Right Eye Distance:   Left Eye Distance:   Bilateral Distance:    Right Eye Near:   Left Eye Near:    Bilateral Near:     Physical Exam  GEN: alert, well appearing male, in no acute distress  EYES: no scleral injection CV: regular rate and rhythm RESP: no increased work of breathing MSK: no extremity edema, no gross deformities NEURO: alert, moves all extremities appropriately SKIN: warm and dry; erythematous patch with warmth and edema to left forearm, right hand erythema with warmth an edema extending to right mid forearm  no visible foreign bodies or parts of insect  appreciated    UC Treatments / Results  Labs (all labs ordered are listed, but only abnormal results are displayed) Labs Reviewed - No data to  display  EKG   Radiology No results found.  Procedures Procedures (including critical  care time)  Medications Ordered in UC Medications - No data to display  Initial Impression / Assessment and Plan / UC Course  I have reviewed the triage vital signs and the nursing notes.  Pertinent labs & imaging results that were available during my care of the patient were reviewed by me and considered in my medical decision making (see chart for details).      Pt is a 78 y.o. male who presents after insect bites to his upper extremities yesterday.  Overall, Stuart Ware is well appearing, well hydrated and in no respiratory distress.  VSS and pt is afebrile.  Prescribed doxycyline, kenalog  and prednisone . Pt advised of adverse effects of prednisone .       Precautions to monitor for flu-like symptoms or fever along with any new rashes. Should these develop he will seek follow up.   Reviewed expectations re: course of current medical issues. Questions answered. Outlined signs and symptoms indicating need for more acute intervention. Patient verbalized understanding. After Visit Summary given.   Final Clinical Impressions(s) / UC Diagnoses   Final diagnoses:  Multiple insect bites  Localized swelling of both forearms  Hand swelling     Discharge Instructions      Stop by the pharmacy to pick up your prescriptions.  Follow up with your primary care provider or return to the urgent care, if not improving.        ED Prescriptions     Medication Sig Dispense Auth. Provider   doxycycline  (VIBRAMYCIN ) 100 MG capsule Take 1 capsule (100 mg total) by mouth 2 (two) times daily. 14 capsule Stuart Kehoe, DO   triamcinolone  ointment (KENALOG ) 0.1 % Apply 1 Application topically 2 (two) times daily. 30 g Stuart Chaplin, DO   predniSONE  (DELTASONE ) 20 MG tablet Take 2 tablets (40 mg total) by mouth daily for 3 days. 6 tablet Stuart Berth, DO      PDMP not reviewed this  encounter.              Stuart Stocking, DO 09/17/23 [phone]

## 2024-01-05 ENCOUNTER — Ambulatory Visit
Admission: EM | Admit: 2024-01-05 | Discharge: 2024-01-05 | Disposition: A | Discharge status: Home / Self Care | Attending: Emergency Medicine | Admitting: Emergency Medicine

## 2024-01-05 DIAGNOSIS — R21 Rash and other nonspecific skin eruption: Secondary | ICD-10-CM | POA: Diagnosis not present

## 2024-01-05 DIAGNOSIS — Z8639 Personal history of other endocrine, nutritional and metabolic disease: Secondary | ICD-10-CM | POA: Diagnosis not present

## 2024-01-05 LAB — GLUCOSE, POCT (MANUAL RESULT ENTRY): POCT Glucose (KUC): 117 mg/dL — AB (ref 70–99)

## 2024-01-05 MED ORDER — HYDROXYZINE HCL 10 MG PO TABS: 10.0000 mg | ORAL_TABLET | Freq: Two times a day (BID) | ORAL | 0 refills | Status: AC | PRN

## 2024-01-05 NOTE — ED Provider Notes (Signed)
 [facility]    CSN: [id number] Arrival date & time: 01/05/24  1002      History   Chief Complaint Chief Complaint  Patient presents with   Rash    HPI Stuart Ware is a 78 y.o. male.   78 year old male pt, Stuart Ware, dents to urgent care for rash on trunk after using compression socks.  Patient denies new lotions soaps or medications.  Patient takes hydrocodone daily.  Past medical history: Diabetes, skin cancer ,chronic pain, lymphedema  PCP is at [facility] in [place]  The history is provided by the patient. No language interpreter was used.  Rash Associated symptoms: no fever     Past Medical History:  Diagnosis Date   Adenomatous polyp of colon    Anxiety    Cancer (HCC)    skin   CHL (conductive hearing loss)    Chronic otitis media    COPD (chronic obstructive pulmonary disease) (HCC)    Diabetes mellitus without complication (HCC)    GERD (gastroesophageal reflux disease)    Hypercholesteremia    Melanoma of shoulder (HCC)    Nicotine abuse    Paroxysmal supraventricular tachycardia    Partial deafness    PVC (premature ventricular contraction)    Sleep apnea    SNHL (sensorineural hearing loss)     Patient Active Problem List   Diagnosis Date Noted   Rash and nonspecific skin eruption 01/05/2024   History of diabetes mellitus 01/05/2024    Past Surgical History:  Procedure Laterality Date   CARDIAC CATHETERIZATION     COLONOSCOPY     INNER EAR SURGERY Left    and tympanoplasty   LARYNGOSCOPY     shave biopsy Right    shoulder melanoma   SHOULDER ARTHROSCOPY WITH ROTATOR CUFF REPAIR Right    SKIN BIOPSY         Home Medications    Prior to Admission medications  Medication Sig Start Date End Date Taking? Authorizing Provider  Albuterol  Sulfate 108 (90 Base) MCG/ACT AEPB Inhale into the lungs.   Yes [provider]  aspirin EC 81 MG tablet Take 81 mg by mouth daily.   Yes [provider]   budesonide-formoterol (SYMBICORT) 160-4.5 MCG/ACT inhaler Inhale 2 puffs into the lungs 2 (two) times daily.   Yes [provider]  enalapril (VASOTEC) 20 MG tablet Take 20 mg by mouth daily.   Yes [provider]  gabapentin (NEURONTIN) 300 MG capsule Take 300 mg by mouth 3 (three) times daily.   Yes [provider]  HYDROcodone-acetaminophen (NORCO/VICODIN) 5-325 MG tablet Take 1 tablet by mouth every 6 (six) hours as needed for moderate pain.   Yes [provider]  hydrOXYzine (ATARAX) 10 MG tablet Take 1 tablet (10 mg total) by mouth every 12 (twelve) hours as needed for up to 5 days for itching. 01/05/24 01/10/24 Yes Markos Theil, NP  metFORMIN (GLUCOPHAGE) 500 MG tablet Take by mouth 2 (two) times daily with a meal.   Yes [provider]  metoprolol succinate (TOPROL-XL) 50 MG 24 hr tablet Take 50 mg by mouth daily. Take with or immediately following a meal.   Yes [provider]  naproxen (NAPROSYN) 500 MG tablet Take 500 mg by mouth 2 (two) times daily with a meal.   Yes [provider]  omeprazole (PRILOSEC) 20 MG capsule Take 20 mg by mouth daily.   Yes [provider]  simvastatin (ZOCOR) 80 MG tablet Take 80  mg by mouth daily.   Yes [provider]  tiotropium (SPIRIVA) 18 MCG inhalation capsule Place 18 mcg into inhaler and inhale daily.   Yes [provider]  triamcinolone  ointment (KENALOG ) 0.1 % Apply 1 Application topically 2 (two) times daily. 09/16/23  Yes Brimage, Vondra, DO  doxycycline  (VIBRAMYCIN ) 100 MG capsule Take 1 capsule (100 mg total) by mouth 2 (two) times daily. 09/16/23   Brimage, Vondra, DO  pregabalin  (LYRICA ) 50 MG capsule Take 2 capsules (100 mg total) by mouth 2 (two) times daily. 10/06/21 11/05/21  Clide Burnard Ee, MD    Family History Family History  Problem Relation Age of Onset   COPD Mother    Heart disease Father     Social History Social  History[1]   Allergies   Penicillins   Review of Systems Review of Systems  Constitutional:  Negative for fever.  Skin:  Positive for rash.  All other systems reviewed and are negative.    Physical Exam Triage Vital Signs ED Triage Vitals  Encounter Vitals Group     BP 01/05/24 1129 126/77     Girls Systolic BP Percentile --      Girls Diastolic BP Percentile --      Boys Systolic BP Percentile --      Boys Diastolic BP Percentile --      Pulse Rate 01/05/24 1129 91     Resp 01/05/24 1129 12     Temp 01/05/24 1129 97.9 F (36.6 C)     Temp Source 01/05/24 1129 Oral     SpO2 01/05/24 1129 95 %     Weight 01/05/24 1127 191 lb (86.6 kg)     Height --      Head Circumference --      Peak Flow --      Pain Score 01/05/24 1127 0     Pain Loc --      Pain Education --      Exclude from Growth Chart --    No data found.  Updated Vital Signs BP 126/77 (BP Location: Left Arm)   Pulse 91   Temp 97.9 F (36.6 C) (Oral)   Resp 12   Wt 191 lb (86.6 kg)   SpO2 95%   BMI 24.52 kg/m   Visual Acuity Right Eye Distance:   Left Eye Distance:   Bilateral Distance:    Right Eye Near:   Left Eye Near:    Bilateral Near:     Physical Exam Vitals and nursing note reviewed.  Cardiovascular:     Rate and Rhythm: Normal rate.  Pulmonary:     Effort: Pulmonary effort is normal.  Skin:    Findings: Rash present.     Comments: Itchy,dry skin  Neurological:     General: No focal deficit present.     Mental Status: He is alert and oriented to person, place, and time.     GCS: GCS eye subscore is 4. GCS verbal subscore is 5. GCS motor subscore is 6.  Psychiatric:        Attention and Perception: Attention normal.        Mood and Affect: Mood normal.        Speech: Speech normal.        Behavior: Behavior normal. Behavior is cooperative.      UC Treatments / Results  Labs (all labs ordered are listed, but only abnormal results are displayed) Labs Reviewed  GLUCOSE,  POCT (MANUAL RESULT ENTRY) - Abnormal;  Notable for the following components:      Result Value   POCT Glucose ([facility]) 117 (*)    All other components within normal limits    EKG   Radiology No results found.  Procedures Procedures (including critical care time)  Medications Ordered in UC Medications - No data to display  Initial Impression / Assessment and Plan / UC Course  I have reviewed the triage vital signs and the nursing notes.  Pertinent labs & imaging results that were available during my care of the patient were reviewed by me and considered in my medical decision making (see chart for details).  Clinical Course as of 01/05/24 1214  Thu Jan 05, 2024  1206 Bs is 117 [JD]    Clinical Course User Index [JD] Zaine Elsass, Rilla, NP   Blood sugar is 117 in office , vital signs are stable, patient appears nontoxic ,discussed exam findings and plan of care with patient, will refer patient back to the [facility] for further evaluation, Atarax scripted, patient aware his pain meds may be causing his itching.  Strict go to ER precautions given, patient verbalized understanding this provider.  Ddx: Pruritus, dry skin,allergies, insect bite Final Clinical Impressions(s) / UC Diagnoses   Final diagnoses:  History of diabetes mellitus  Rash and nonspecific skin eruption     Discharge Instructions      [facility], may be your pain meds making you itch May need dermatology evaluation Avoid heat, hot water, use eucerin lotion to help with dryness of skin, avoid scratching If your itching or rash gets worse go to the emergency room for further evaluation     ED Prescriptions     Medication Sig Dispense Auth. Provider   hydrOXYzine (ATARAX) 10 MG tablet Take 1 tablet (10 mg total) by mouth every 12 (twelve) hours as needed for up to 5 days for itching. 10 tablet Mathilde Mcwherter, Rilla, NP      PDMP not reviewed this encounter.    [1]  Social History Tobacco  Use   Smoking status: Former   Smokeless tobacco: Never  Vaping Use   Vaping status: Never Used  Substance Use Topics   Alcohol use: No   Drug use: No     Greysin Medlen, Rilla, NP 01/05/24 1214

## 2024-01-05 NOTE — Discharge Instructions (Addendum)
 Go to [facility] for follow up of rash, may be your pain meds making you itch May need dermatology evaluation Avoid heat, hot water, use eucerin lotion to help with dryness of skin, avoid scratching If your itching or rash gets worse go to the emergency room for further evaluation

## 2024-01-05 NOTE — ED Triage Notes (Signed)
 Pt c/o left leg rash x4days  Pt states that he has lymphedema and is having left leg and foot swelling  Pt ordered a compression device for his leg and was told to wear compression socks while it is shipped  Pt states that while using the compression socks, he is now breaking out in spots along his back and chest.  Pt is unsure if the compression sock is causing the rash.

## 2024-02-01 ENCOUNTER — Ambulatory Visit: Admitting: Pain Medicine

## 2024-02-08 ENCOUNTER — Ambulatory Visit: Admitting: Pain Medicine
